# Patient Record
Sex: Male | Born: 1960 | Hispanic: Yes | Marital: Married | Smoking: Never smoker
Health system: Southern US, Community
[De-identification: ages and names within clinical notes are randomized; demographics above are authoritative.]

## PROBLEM LIST (undated history)

## (undated) DIAGNOSIS — E7800 Pure hypercholesterolemia, unspecified: Secondary | ICD-10-CM

## (undated) DIAGNOSIS — I10 Essential (primary) hypertension: Secondary | ICD-10-CM

## (undated) DIAGNOSIS — E11319 Type 2 diabetes mellitus with unspecified diabetic retinopathy without macular edema: Secondary | ICD-10-CM

## (undated) DIAGNOSIS — E119 Type 2 diabetes mellitus without complications: Secondary | ICD-10-CM

## (undated) HISTORY — PX: NO PAST SURGERIES: SHX2092

---

## 2019-08-02 ENCOUNTER — Ambulatory Visit: Payer: Self-pay

## 2019-08-02 ENCOUNTER — Ambulatory Visit: Payer: Self-pay | Attending: Internal Medicine

## 2019-08-02 DIAGNOSIS — Z23 Encounter for immunization: Secondary | ICD-10-CM

## 2019-08-02 NOTE — Progress Notes (Signed)
   Covid-19 Vaccination Clinic  Name:  Jack Gonzalez    MRN: [id number] DOB: 03-02-1961  08/02/2019  Jack Gonzalez was observed post Covid-19 immunization for 15 minutes without incident. He was provided with Vaccine Information Sheet and instruction to access the V-Safe system.   Jack Gonzalez was instructed to call 911 with any severe reactions post vaccine: Marland Kitchen Difficulty breathing  . Swelling of face and throat  . A fast heartbeat  . A bad rash all over body  . Dizziness and weakness   Immunizations Administered    Name Date Dose VIS Date Route   Pfizer COVID-19 Vaccine 08/02/2019  4:35 PM 0.3 mL 04/20/2019 Intramuscular   Manufacturer: [employer], [employer]   Lot: [id number]   NDC: [address]-2

## 2019-08-27 ENCOUNTER — Ambulatory Visit: Payer: Self-pay | Attending: Internal Medicine

## 2019-08-27 DIAGNOSIS — Z23 Encounter for immunization: Secondary | ICD-10-CM

## 2019-08-27 NOTE — Progress Notes (Signed)
   Covid-19 Vaccination Clinic  Name:  Jack Gonzalez    MRN: [id number] DOB: [birth year]  08/27/2019  [name] was observed post Covid-19 immunization for 15 minutes without incident. He was provided with Vaccine Information Sheet and instruction to access the V-Safe system.   [name] was instructed to call 911 with any severe reactions post vaccine: Marland Kitchen Difficulty breathing  . Swelling of face and throat  . A fast heartbeat  . A bad rash all over body  . Dizziness and weakness   Immunizations Administered    Name Date Dose VIS Date Route   Pfizer COVID-19 Vaccine 08/27/2019  9:06 AM 0.3 mL 07/04/2018 Intramuscular   Manufacturer: [employer], [employer]   Lot: [id number]   NDC: [address]-8

## 2020-05-01 ENCOUNTER — Inpatient Hospital Stay (HOSPITAL_BASED_OUTPATIENT_CLINIC_OR_DEPARTMENT_OTHER)
Admission: EM | Admit: 2020-05-01 | Discharge: 2020-05-07 | DRG: 193 | Disposition: A | Discharge status: Home / Self Care | Payer: BC Managed Care – PPO | Attending: Family Medicine | Admitting: Family Medicine

## 2020-05-01 ENCOUNTER — Other Ambulatory Visit: Payer: Self-pay

## 2020-05-01 ENCOUNTER — Emergency Department (HOSPITAL_BASED_OUTPATIENT_CLINIC_OR_DEPARTMENT_OTHER): Payer: BC Managed Care – PPO

## 2020-05-01 ENCOUNTER — Encounter (HOSPITAL_BASED_OUTPATIENT_CLINIC_OR_DEPARTMENT_OTHER): Payer: Self-pay | Admitting: Emergency Medicine

## 2020-05-01 DIAGNOSIS — I160 Hypertensive urgency: Secondary | ICD-10-CM | POA: Diagnosis not present

## 2020-05-01 DIAGNOSIS — E875 Hyperkalemia: Secondary | ICD-10-CM | POA: Diagnosis not present

## 2020-05-01 DIAGNOSIS — E669 Obesity, unspecified: Secondary | ICD-10-CM | POA: Diagnosis present

## 2020-05-01 DIAGNOSIS — Z6841 Body Mass Index (BMI) 40.0 and over, adult: Secondary | ICD-10-CM

## 2020-05-01 DIAGNOSIS — R062 Wheezing: Secondary | ICD-10-CM

## 2020-05-01 DIAGNOSIS — J129 Viral pneumonia, unspecified: Secondary | ICD-10-CM | POA: Diagnosis present

## 2020-05-01 DIAGNOSIS — Z20822 Contact with and (suspected) exposure to covid-19: Secondary | ICD-10-CM | POA: Diagnosis not present

## 2020-05-01 DIAGNOSIS — Z8701 Personal history of pneumonia (recurrent): Secondary | ICD-10-CM

## 2020-05-01 DIAGNOSIS — I161 Hypertensive emergency: Secondary | ICD-10-CM | POA: Diagnosis not present

## 2020-05-01 DIAGNOSIS — N179 Acute kidney failure, unspecified: Secondary | ICD-10-CM | POA: Diagnosis not present

## 2020-05-01 DIAGNOSIS — E7800 Pure hypercholesterolemia, unspecified: Secondary | ICD-10-CM | POA: Diagnosis not present

## 2020-05-01 DIAGNOSIS — Z833 Family history of diabetes mellitus: Secondary | ICD-10-CM

## 2020-05-01 DIAGNOSIS — I248 Other forms of acute ischemic heart disease: Secondary | ICD-10-CM | POA: Diagnosis not present

## 2020-05-01 DIAGNOSIS — R0902 Hypoxemia: Secondary | ICD-10-CM

## 2020-05-01 DIAGNOSIS — T508X5A Adverse effect of diagnostic agents, initial encounter: Secondary | ICD-10-CM | POA: Diagnosis not present

## 2020-05-01 DIAGNOSIS — E1165 Type 2 diabetes mellitus with hyperglycemia: Secondary | ICD-10-CM | POA: Diagnosis not present

## 2020-05-01 DIAGNOSIS — Z8616 Personal history of COVID-19: Secondary | ICD-10-CM

## 2020-05-01 DIAGNOSIS — B338 Other specified viral diseases: Secondary | ICD-10-CM

## 2020-05-01 DIAGNOSIS — E8770 Fluid overload, unspecified: Secondary | ICD-10-CM

## 2020-05-01 DIAGNOSIS — J9601 Acute respiratory failure with hypoxia: Secondary | ICD-10-CM | POA: Diagnosis not present

## 2020-05-01 DIAGNOSIS — E860 Dehydration: Secondary | ICD-10-CM | POA: Diagnosis present

## 2020-05-01 DIAGNOSIS — I5031 Acute diastolic (congestive) heart failure: Secondary | ICD-10-CM | POA: Diagnosis not present

## 2020-05-01 DIAGNOSIS — E785 Hyperlipidemia, unspecified: Secondary | ICD-10-CM | POA: Diagnosis present

## 2020-05-01 DIAGNOSIS — E11319 Type 2 diabetes mellitus with unspecified diabetic retinopathy without macular edema: Secondary | ICD-10-CM | POA: Diagnosis present

## 2020-05-01 DIAGNOSIS — T380X5A Adverse effect of glucocorticoids and synthetic analogues, initial encounter: Secondary | ICD-10-CM | POA: Diagnosis not present

## 2020-05-01 DIAGNOSIS — I110 Hypertensive heart disease with heart failure: Secondary | ICD-10-CM | POA: Diagnosis not present

## 2020-05-01 DIAGNOSIS — F1010 Alcohol abuse, uncomplicated: Secondary | ICD-10-CM | POA: Diagnosis not present

## 2020-05-01 DIAGNOSIS — J121 Respiratory syncytial virus pneumonia: Secondary | ICD-10-CM | POA: Diagnosis not present

## 2020-05-01 DIAGNOSIS — E871 Hypo-osmolality and hyponatremia: Secondary | ICD-10-CM | POA: Diagnosis not present

## 2020-05-01 DIAGNOSIS — Z8249 Family history of ischemic heart disease and other diseases of the circulatory system: Secondary | ICD-10-CM

## 2020-05-01 DIAGNOSIS — R0602 Shortness of breath: Secondary | ICD-10-CM | POA: Diagnosis present

## 2020-05-01 HISTORY — DX: Type 2 diabetes mellitus without complications: E11.9

## 2020-05-01 HISTORY — DX: Essential (primary) hypertension: I10

## 2020-05-01 HISTORY — DX: Type 2 diabetes mellitus with unspecified diabetic retinopathy without macular edema: E11.319

## 2020-05-01 HISTORY — DX: Pure hypercholesterolemia, unspecified: E78.00

## 2020-05-01 LAB — RESP PANEL BY RT-PCR (FLU A&B, COVID) ARPGX2
Influenza A by PCR: NEGATIVE
Influenza B by PCR: NEGATIVE
SARS Coronavirus 2 by RT PCR: NEGATIVE

## 2020-05-01 LAB — COMPREHENSIVE METABOLIC PANEL
ALT: 20 U/L (ref 0–44)
AST: 25 U/L (ref 15–41)
Albumin: 2.8 g/dL — ABNORMAL LOW (ref 3.5–5.0)
Alkaline Phosphatase: 114 U/L (ref 38–126)
Anion gap: 8 (ref 5–15)
BUN: 29 mg/dL — ABNORMAL HIGH (ref 6–20)
CO2: 23 mmol/L (ref 22–32)
Calcium: 8 mg/dL — ABNORMAL LOW (ref 8.9–10.3)
Chloride: 98 mmol/L (ref 98–111)
Creatinine, Ser: 1.23 mg/dL (ref 0.61–1.24)
GFR, Estimated: >60 mL/min (ref >60)
Glucose, Bld: 122 mg/dL — ABNORMAL HIGH (ref 70–99)
Potassium: 4.7 mmol/L (ref 3.5–5.1)
Sodium: 129 mmol/L — ABNORMAL LOW (ref 135–145)
Total Bilirubin: 0.4 mg/dL (ref 0.3–1.2)
Total Protein: 6.7 g/dL (ref 6.5–8.1)

## 2020-05-01 LAB — CBC WITH DIFFERENTIAL/PLATELET
Abs Immature Granulocytes: 0.01 10*3/uL (ref 0.00–0.07)
Basophils Absolute: 0 10*3/uL (ref 0.0–0.1)
Basophils Relative: 1 %
Eosinophils Absolute: 0.1 10*3/uL (ref 0.0–0.5)
Eosinophils Relative: 2 %
HCT: 41.5 % (ref 39.0–52.0)
Hemoglobin: 13.8 g/dL (ref 13.0–17.0)
Immature Granulocytes: 0 %
Lymphocytes Relative: 16 %
Lymphs Abs: 0.8 10*3/uL (ref 0.7–4.0)
MCH: 30.5 pg (ref 26.0–34.0)
MCHC: 33.3 g/dL (ref 30.0–36.0)
MCV: 91.6 fL (ref 80.0–100.0)
Monocytes Absolute: 0.6 10*3/uL (ref 0.1–1.0)
Monocytes Relative: 12 %
Neutro Abs: 3.5 10*3/uL (ref 1.7–7.7)
Neutrophils Relative %: 69 %
Platelets: 178 10*3/uL (ref 150–400)
RBC: 4.53 MIL/uL (ref 4.22–5.81)
RDW: 12.6 % (ref 11.5–15.5)
WBC: 5.1 10*3/uL (ref 4.0–10.5)
nRBC: 0 % (ref 0.0–0.2)

## 2020-05-01 LAB — LACTIC ACID, PLASMA
Lactic Acid, Venous: 1 mmol/L (ref 0.5–1.9)
Lactic Acid, Venous: 0.7 mmol/L (ref 0.5–1.9)

## 2020-05-01 LAB — TROPONIN I (HIGH SENSITIVITY)
Troponin I (High Sensitivity): 22 ng/L — ABNORMAL HIGH (ref <18)
Troponin I (High Sensitivity): 19 ng/L — ABNORMAL HIGH (ref <18)

## 2020-05-01 LAB — GLUCOSE, CAPILLARY
Glucose-Capillary: 308 mg/dL — ABNORMAL HIGH (ref 70–99)
Glucose-Capillary: 337 mg/dL — ABNORMAL HIGH (ref 70–99)

## 2020-05-01 LAB — BRAIN NATRIURETIC PEPTIDE: B Natriuretic Peptide: 169.2 pg/mL — ABNORMAL HIGH (ref 0.0–100.0)

## 2020-05-01 LAB — D-DIMER, QUANTITATIVE: D-Dimer, Quant: 1.51 ug/mL-FEU — ABNORMAL HIGH (ref 0.00–0.50)

## 2020-05-01 MED ORDER — LORAZEPAM 2 MG/ML IJ SOLN: 1.0000 mg | INTRAMUSCULAR | Status: AC | PRN

## 2020-05-01 MED ORDER — METHYLPREDNISOLONE SODIUM SUCC 125 MG IJ SOLR
60.0000 mg | INTRAMUSCULAR | Status: DC
  Administered 2020-05-02: 07:00:00 60 mg via INTRAVENOUS
  Filled 2020-05-01: qty 2

## 2020-05-01 MED ORDER — SODIUM CHLORIDE 0.9% FLUSH
3.0000 mL | Freq: Two times a day (BID) | INTRAVENOUS | Status: DC
  Administered 2020-05-01 – 2020-05-06 (×7): 3 mL via INTRAVENOUS

## 2020-05-01 MED ORDER — THIAMINE HCL 100 MG PO TABS
100.0000 mg | ORAL_TABLET | Freq: Every day | ORAL | Status: DC
  Administered 2020-05-01 – 2020-05-07 (×7): 100 mg via ORAL
  Filled 2020-05-01 (×7): qty 1

## 2020-05-01 MED ORDER — HYDRALAZINE HCL 20 MG/ML IJ SOLN
10.0000 mg | Freq: Four times a day (QID) | INTRAMUSCULAR | Status: DC | PRN
  Administered 2020-05-02 – 2020-05-07 (×4): 10 mg via INTRAVENOUS
  Filled 2020-05-01 (×4): qty 1

## 2020-05-01 MED ORDER — ONDANSETRON HCL 4 MG/2ML IJ SOLN: 4.0000 mg | Freq: Four times a day (QID) | INTRAMUSCULAR | Status: DC | PRN

## 2020-05-01 MED ORDER — ALBUTEROL SULFATE (2.5 MG/3ML) 0.083% IN NEBU
2.5000 mg | INHALATION_SOLUTION | RESPIRATORY_TRACT | Status: DC | PRN
  Administered 2020-05-02 – 2020-05-03 (×3): 2.5 mg via RESPIRATORY_TRACT
  Filled 2020-05-01 (×3): qty 3

## 2020-05-01 MED ORDER — ALBUTEROL SULFATE HFA 108 (90 BASE) MCG/ACT IN AERS
INHALATION_SPRAY | RESPIRATORY_TRACT | Status: AC
  Administered 2020-05-01: 6
  Filled 2020-05-01: qty 6.7

## 2020-05-01 MED ORDER — ATORVASTATIN CALCIUM 40 MG PO TABS
80.0000 mg | ORAL_TABLET | Freq: Every day | ORAL | Status: DC
  Administered 2020-05-01 – 2020-05-07 (×7): 80 mg via ORAL
  Filled 2020-05-01 (×7): qty 2

## 2020-05-01 MED ORDER — SODIUM CHLORIDE 0.9% FLUSH: 3.0000 mL | INTRAVENOUS | Status: DC | PRN

## 2020-05-01 MED ORDER — LISINOPRIL 20 MG PO TABS
40.0000 mg | ORAL_TABLET | Freq: Every day | ORAL | Status: DC
  Administered 2020-05-01: 22:00:00 40 mg via ORAL
  Filled 2020-05-01: qty 2

## 2020-05-01 MED ORDER — ADULT MULTIVITAMIN W/MINERALS CH
1.0000 | ORAL_TABLET | Freq: Every day | ORAL | Status: DC
  Administered 2020-05-01 – 2020-05-07 (×7): 1 via ORAL
  Filled 2020-05-01 (×7): qty 1

## 2020-05-01 MED ORDER — ACETAMINOPHEN 650 MG RE SUPP: 650.0000 mg | Freq: Four times a day (QID) | RECTAL | Status: DC | PRN

## 2020-05-01 MED ORDER — SODIUM CHLORIDE 0.9 % IV SOLN: 250.0000 mL | INTRAVENOUS | Status: DC | PRN

## 2020-05-01 MED ORDER — LABETALOL HCL 5 MG/ML IV SOLN
10.0000 mg | Freq: Once | INTRAVENOUS | Status: AC
  Administered 2020-05-01: 10 mg via INTRAVENOUS
  Filled 2020-05-01: qty 4

## 2020-05-01 MED ORDER — ONDANSETRON HCL 4 MG PO TABS: 4.0000 mg | ORAL_TABLET | Freq: Four times a day (QID) | ORAL | Status: DC | PRN

## 2020-05-01 MED ORDER — LORAZEPAM 1 MG PO TABS: 1.0000 mg | ORAL_TABLET | ORAL | Status: AC | PRN

## 2020-05-01 MED ORDER — IOHEXOL 350 MG/ML SOLN
100.0000 mL | Freq: Once | INTRAVENOUS | Status: AC | PRN
  Administered 2020-05-01: 12:00:00 100 mL via INTRAVENOUS

## 2020-05-01 MED ORDER — POLYETHYLENE GLYCOL 3350 17 G PO PACK: 17.0000 g | PACK | Freq: Every day | ORAL | Status: DC | PRN

## 2020-05-01 MED ORDER — INSULIN GLARGINE 100 UNIT/ML ~~LOC~~ SOLN
60.0000 [IU] | Freq: Every day | SUBCUTANEOUS | Status: DC
  Administered 2020-05-02 – 2020-05-03 (×2): 60 [IU] via SUBCUTANEOUS
  Filled 2020-05-01 (×2): qty 0.6

## 2020-05-01 MED ORDER — CHLORTHALIDONE 25 MG PO TABS
25.0000 mg | ORAL_TABLET | Freq: Every day | ORAL | Status: DC
  Administered 2020-05-01: 22:00:00 25 mg via ORAL
  Filled 2020-05-01: qty 1

## 2020-05-01 MED ORDER — THIAMINE HCL 100 MG/ML IJ SOLN
100.0000 mg | Freq: Every day | INTRAMUSCULAR | Status: DC
  Filled 2020-05-01 (×2): qty 2

## 2020-05-01 MED ORDER — INSULIN ASPART 100 UNIT/ML ~~LOC~~ SOLN
0.0000 [IU] | Freq: Three times a day (TID) | SUBCUTANEOUS | Status: DC
  Administered 2020-05-02: 13:00:00 8 [IU] via SUBCUTANEOUS
  Administered 2020-05-02: 17:00:00 15 [IU] via SUBCUTANEOUS
  Administered 2020-05-02: 09:00:00 3 [IU] via SUBCUTANEOUS

## 2020-05-01 MED ORDER — ACETAMINOPHEN 325 MG PO TABS: 650.0000 mg | ORAL_TABLET | Freq: Four times a day (QID) | ORAL | Status: DC | PRN

## 2020-05-01 MED ORDER — METHYLPREDNISOLONE SODIUM SUCC 125 MG IJ SOLR
125.0000 mg | Freq: Once | INTRAMUSCULAR | Status: AC
  Administered 2020-05-01: 10:00:00 125 mg via INTRAVENOUS
  Filled 2020-05-01: qty 2

## 2020-05-01 MED ORDER — INSULIN ASPART 100 UNIT/ML ~~LOC~~ SOLN
0.0000 [IU] | Freq: Every day | SUBCUTANEOUS | Status: DC
  Administered 2020-05-01: 21:00:00 4 [IU] via SUBCUTANEOUS
  Administered 2020-05-02: 23:00:00 5 [IU] via SUBCUTANEOUS

## 2020-05-01 MED ORDER — FOLIC ACID 1 MG PO TABS
1.0000 mg | ORAL_TABLET | Freq: Every day | ORAL | Status: DC
  Administered 2020-05-01 – 2020-05-07 (×7): 1 mg via ORAL
  Filled 2020-05-01 (×7): qty 1

## 2020-05-01 MED ORDER — ENOXAPARIN SODIUM 80 MG/0.8ML ~~LOC~~ SOLN
65.0000 mg | SUBCUTANEOUS | Status: DC
  Administered 2020-05-01 – 2020-05-03 (×3): 65 mg via SUBCUTANEOUS
  Filled 2020-05-01 (×3): qty 0.8

## 2020-05-01 NOTE — ED Provider Notes (Signed)
[facility] EMERGENCY DEPARTMENT Provider Note   CSN: [id number] Arrival date & time: 05/01/20  2025     History Chief Complaint  Patient presents with  . Shortness of Breath    Jack Gonzalez is a 59 y.o. male.  The history is provided by the patient and medical records. The history is limited by a language barrier. A language interpreter was used (due to emergent condition and respiratory distress, son was used for initial interpretation).  Shortness of Breath Severity:  Severe Onset quality:  Gradual Duration:  4 days Timing:  Constant Progression:  Worsening Chronicity:  New Context: URI and weather changes   Relieved by:  Nothing Worsened by:  Coughing Ineffective treatments:  None tried Associated symptoms: cough, sputum production and wheezing   Associated symptoms: no abdominal pain, no chest pain, no diaphoresis, no fever, no headaches, no hemoptysis, no rash and no vomiting        Past Medical History:  Diagnosis Date  . Diabetes mellitus without complication (HCC)   . High cholesterol   . Hypertension     There are no problems to display for this patient.   History reviewed. No pertinent surgical history.     No family history on file.  Social History   Tobacco Use  . Smoking status: Never Smoker  . Smokeless tobacco: Never Used  Substance Use Topics  . Alcohol use: Yes    Home Medications Prior to Admission medications   Not on File    Allergies    Patient has no known allergies.  Review of Systems   Review of Systems  Constitutional: Positive for chills and fatigue. Negative for diaphoresis and fever.  HENT: Positive for congestion.   Eyes: Negative for visual disturbance.  Respiratory: Positive for cough, sputum production, chest tightness, shortness of breath and wheezing. Negative for hemoptysis and stridor.   Cardiovascular: Positive for leg swelling (at baseline). Negative for chest pain and palpitations.   Gastrointestinal: Negative for abdominal pain, constipation, diarrhea, nausea and vomiting.  Genitourinary: Negative for flank pain and frequency.  Musculoskeletal: Negative for back pain and neck stiffness.  Skin: Negative for rash and wound.  Neurological: Negative for dizziness, light-headedness and headaches.  Psychiatric/Behavioral: Negative for agitation.  All other systems reviewed and are negative.   Physical Exam Updated Vital Signs BP (!) 215/101 (BP Location: Right Arm)   Pulse 84   Temp 99.5 F (37.5 C) (Oral)   Resp (!) 28   Ht 5\' 10"  (1.778 m)   Wt 132.5 kg   SpO2 99%   BMI 41.90 kg/m   Physical Exam Vitals and nursing note reviewed.  Constitutional:      General: He is in acute distress.     Appearance: He is well-developed and well-nourished. He is obese. He is ill-appearing. He is not toxic-appearing or diaphoretic.  HENT:     Head: Normocephalic and atraumatic.     Mouth/Throat:     Pharynx: Oropharynx is clear.  Eyes:     Conjunctiva/sclera: Conjunctivae normal.     Pupils: Pupils are equal, round, and reactive to light.  Cardiovascular:     Rate and Rhythm: Normal rate and regular rhythm.  No extrasystoles are present.    Pulses: Normal pulses.     Heart sounds: No murmur heard.   Pulmonary:     Effort: Pulmonary effort is normal. Tachypnea present. No respiratory distress.     Breath sounds: Wheezing, rhonchi and rales present.  Chest:  Chest wall: No tenderness.  Abdominal:     Palpations: Abdomen is soft.     Tenderness: There is no abdominal tenderness.  Musculoskeletal:     Cervical back: Normal range of motion and neck supple.     Right lower leg: No tenderness. Edema present.     Left lower leg: No tenderness. Edema present.  Skin:    General: Skin is warm and dry.     Coloration: Skin is not pale.     Findings: No erythema.  Neurological:     General: No focal deficit present.     Mental Status: He is alert.  Psychiatric:         Mood and Affect: Mood and affect and mood normal.     ED Results / Procedures / Treatments   Labs (all labs ordered are listed, but only abnormal results are displayed) Labs Reviewed  COMPREHENSIVE METABOLIC PANEL - Abnormal; Notable for the following components:      Result Value   Sodium 129 (*)    Glucose, Bld 122 (*)    BUN 29 (*)    Calcium 8.0 (*)    Albumin 2.8 (*)    All other components within normal limits  BRAIN NATRIURETIC PEPTIDE - Abnormal; Notable for the following components:   B Natriuretic Peptide 169.2 (*)    All other components within normal limits  D-DIMER, QUANTITATIVE (NOT AT [facility]) - Abnormal; Notable for the following components:   D-Dimer, Quant 1.51 (*)    All other components within normal limits  TROPONIN I (HIGH SENSITIVITY) - Abnormal; Notable for the following components:   Troponin I (High Sensitivity) 22 (*)    All other components within normal limits  TROPONIN I (HIGH SENSITIVITY) - Abnormal; Notable for the following components:   Troponin I (High Sensitivity) 19 (*)    All other components within normal limits  RESP PANEL BY RT-PCR (FLU A&B, COVID) ARPGX2  CULTURE, BLOOD (ROUTINE X 2)  CULTURE, BLOOD (ROUTINE X 2)  CBC WITH DIFFERENTIAL/PLATELET  LACTIC ACID, PLASMA  LACTIC ACID, PLASMA    EKG None ED ECG REPORT   Date: 05/01/2020  Rate: 84  Rhythm: normal sinus rhythm  QRS Axis: normal  Intervals: PR prolonged  ST/T Wave abnormalities: early repolarization  Conduction Disutrbances:none  Narrative Interpretation:   Old EKG Reviewed: none available  I have personally reviewed the EKG tracing and agree with the computerized printout as noted.    Radiology CT Angio Chest PE W and/or Wo Contrast  Result Date: 05/01/2020 CLINICAL DATA:  Cough and shortness of breath. Question pulmonary emboli. Low oxygen saturation. EXAM: CT ANGIOGRAPHY CHEST WITH CONTRAST TECHNIQUE: Multidetector CT imaging of the chest was performed  using the standard protocol during bolus administration of intravenous contrast. Multiplanar CT image reconstructions and MIPs were obtained to evaluate the vascular anatomy. CONTRAST:  OMNIPAQUE IOHEXOL 350 MG/ML SOLN COMPARISON:  Chest radiography same day FINDINGS: Cardiovascular: Heart size upper limits of normal. Coronary artery calcification is noted. Aortic atherosclerotic calcification is noted. No aneurysm or dissection. Pulmonary arterial opacification is good. There are no pulmonary emboli. Mediastinum/Nodes: No mediastinal or hilar mass or lymphadenopathy. Lungs/Pleura: Bilateral hazy and patchy pulmonary infiltrates, most pronounced in the right upper lobe. Findings are nonspecific and could be due to viral or bacterial pneumonia. No dense consolidation or lobar collapse. No effusion. Upper Abdomen: Negative Musculoskeletal: Ordinary thoracic degenerative changes. Review of the MIP images confirms the above findings. IMPRESSION: 1. No pulmonary emboli. 2. Bilateral  hazy and patchy pulmonary infiltrates, most pronounced in the right upper lobe. Findings are nonspecific and could be due to viral or bacterial pneumonia. No dense consolidation or lobar collapse. 3. Aortic atherosclerosis. Coronary artery calcification. Aortic Atherosclerosis (ICD10-I70.0). Electronically Signed   By: Paulina Fusi M.D.   On: 05/01/2020 11:40   DG Chest Portable 1 View  Result Date: 05/01/2020 CLINICAL DATA:  Hypoxia. EXAM: PORTABLE CHEST 1 VIEW COMPARISON:  None. FINDINGS: Enlarged cardiac silhouette. Pulmonary vascular congestion. No consolidation. No visible pleural effusions or pneumothorax. No acute osseous abnormality. IMPRESSION: Cardiomegaly and pulmonary vascular congestion without overt pulmonary edema. No focal consolidation. Electronically Signed   By: Feliberto Harts MD   On: 05/01/2020 10:30    Procedures Procedures (including critical care time)  CRITICAL CARE Performed by: Canary Brim  Hester Joslin Total critical care time: 45 minutes Critical care time was exclusive of separately billable procedures and treating other patients. Critical care was necessary to treat or prevent imminent or life-threatening deterioration. Critical care was time spent personally by me on the following activities: development of treatment plan with patient and/or surrogate as well as nursing, discussions with consultants, evaluation of patient's response to treatment, examination of patient, obtaining history from patient or surrogate, ordering and performing treatments and interventions, ordering and review of laboratory studies, ordering and review of radiographic studies, pulse oximetry and re-evaluation of patient's condition.  Medications Ordered in ED Medications  albuterol (VENTOLIN HFA) 108 (90 Base) MCG/ACT inhaler (6 puffs  Given 05/01/20 1004)  labetalol (NORMODYNE) injection 10 mg (10 mg Intravenous Given 05/01/20 1025)  methylPREDNISolone sodium succinate (SOLU-MEDROL) 125 mg/2 mL injection 125 mg (125 mg Intravenous Given 05/01/20 1021)  iohexol (OMNIPAQUE) 350 MG/ML injection 100 mL (100 mLs Intravenous Contrast Given 05/01/20 1133)    ED Course  I have reviewed the triage vital signs and the nursing notes.  Pertinent labs & imaging results that were available during my care of the patient were reviewed by me and considered in my medical decision making (see chart for details).    MDM Rules/Calculators/A&P                          Annette Lowdermilk is a 59 y.o. male with a past medical history significant for hypertension, hypercholesterolemia, diabetes, recent pneumonia several weeks ago and prior coronavirus infection several months ago who presents with worsening shortness of breath, chest tightness, wheezing, cough, malaise, and hypoxia.  According to son, the patient just got over a pneumonia several weeks ago.  He reports that patient was outside in the cold on Saturday in the rain  and then on Sunday started having shortness of breath and cough.  He reports it is progressed until today when he was very short of breath needing to come to emergency department.  On arrival, patient was found to oxygen saturation of 76% on room air.  Patient reports his wheezing has been progressive and he is coughing up phlegm.  Denies any hemoptysis.  Denies history of DVT or PE.  Reports his legs are all swollen for the last year or so.  Denies history of heart failure.  Denies sick contacts.  He reports that he had delta variant of COVID-19 over 3 months ago and had a recent pneumonia.  He is denying any chest pain right now and denies any abdominal pain.  Denies nausea, vomiting, constipation, diarrhea, or urinary changes.  Denies sick contacts.  On exam, patient has rales, rhonchi,  and wheezing in all lung fields.  Chest and abdomen are nontender.  No murmur.  Legs are edematous bilaterally.  Intact pulses in all extremities.  Patient is warm to the touch and abnormal temperature of 9.5, will get rectal temp.  He is not tachycardic but is tachypneic and hypertensive with blood pressure of 215/101.    Clinical I am concerned about several etiologies.  I am concerned about hypertensive emergency causing flash pulmonary edema leading to this hypoxia versus infection.  We will recheck him for Covid as been several months since he was last positive.  We will get x-ray to look for pneumonia.  We will get a BNP and get his blood pressure improved to look for fluid overload with flash pulmonary edema.  We will give him albuterol and steroids to help with the wheezing component of the shortness of breath.  We will also get troponin and EKG and labs to look for cardiac cause of symptoms.  Patient and family agreed with plan.  They report that they would prefer to be admitted to [facility] if they need admission which anticipate they will given his hypoxia of 76% on room air on arrival.  Anticipate  reassessment after work-up.  10:48 AM Dimer returned positive.  Will order CT PE study which she can get if his creatinine is reassuring.   2:10 PM Patient's work of breathing has improved and is now only on 2 L nasal cannula down from nonrebreather.  His lactic acid was negative x2.  His troponin was elevated at 22 but improved to 19.  He is denying any further chest pain.  Short of breath has somewhat improved but he still requiring oxygen.  His Covid test and flu test were negative however, this facility, the tested him for other viral infections and he was found to have RSV.  When told this, the family says that a child in the family has recently had RSV.  Other labs showed no leukocytosis or anemia.  Metabolic panel showed some hyponatremia and hypocalcemia but otherwise kidney function and liver function were not elevated.  CT PE study was ordered and showed likely viral opacities with no evidence of pulmonary embolism.  Patient reports that he would like to be admitted to [facility] if there is space.  Will call to see about getting admitted there for RSV pneumonia with new oxygen requirement and oxygen saturations in the 70s on arrival.   2:42 PM We spoke with [facility] and they report that they cannot accept the patient for admission for hypoxic respiratory failure related to RSV as they are full.  Patient will thus be admitted to one of our hospitalist and hospitalist team was called for admission.   Final Clinical Impression(s) / ED Diagnoses Final diagnoses:  Hypoxia  RSV (respiratory syncytial virus infection)  Acute respiratory failure with hypoxia (HCC)     Clinical Impression: 1. Hypoxia   2. RSV (respiratory syncytial virus infection)   3. Acute respiratory failure with hypoxia (HCC)     Disposition: Admit  This note was prepared with assistance of Dragon voice recognition software. Occasional wrong-word or sound-a-like substitutions  may have occurred due to the inherent limitations of voice recognition software.     Apryle Stowell, Canary Brimhristopher J, MD 05/01/20 [phone]

## 2020-05-01 NOTE — ED Triage Notes (Signed)
Pt is here with his son who is translating. Pt c/o cough and SOB x 3 days. O2 sats 76% on room air. RT to bedside.

## 2020-05-01 NOTE — ED Notes (Signed)
Report provided to [facility] for report

## 2020-05-01 NOTE — ED Notes (Signed)
ED Provider at bedside. 

## 2020-05-01 NOTE — ED Notes (Signed)
This RN collected 1 set of blood cultures, lactic, 2 lavender tubes, 2 light green tubes, and a blue tube.

## 2020-05-01 NOTE — ED Notes (Signed)
ED Provider at bedside to discuss admission plan

## 2020-05-01 NOTE — ED Notes (Signed)
Lab called to report RSV+. EDP made aware 

## 2020-05-01 NOTE — H&P (Signed)
History and Physical    [name] OXB:[id number] DOB: 19-Jul-1960 DOA: 05/01/2020  PCP: System, Provider Not In  Patient coming from: Home -- > [facility]   Chief Complaint: SOB   HPI: Jack Gonzalez is a 59 y.o. male with medical history significant of DM, HTN, HLD who presents to the hospital with chief complaint of SOB, chest tightness, wheezing, cough, malaise, hypoxia. She was recently treated for pneumonia several weeks ago. On arrival to the ED, her oxygen saturation was found to be 76% on room air. He had also had COVID+ over 3 months ago.  On examination, he denies any fevers or chest pain or abdominal pain or nausea, vomiting or diarrhea.  He does admit to dry cough.  His 30-month-old granddaughter had tested positive for RSV recently.  He also admits to 240 ounce beer consumption on a daily basis, last use yesterday 12/22.  Patient was examined with iPad interpreter as well as son at bedside.  ED Course: Lab called med [facility] ED provider with positive result of RSV.  Patient was given Solu-Medrol as well as IV labetalol and transferred to [facility] for admission.  Review of Systems: As per HPI. Otherwise, all other review of systems reviewed and are negative.   Past Medical History:  Diagnosis Date  . Diabetes mellitus without complication (HCC)   . Diabetic retinopathy (HCC)   . High cholesterol   . Hypertension     Past Surgical History:  Procedure Laterality Date  . NO PAST SURGERIES       reports that he has never smoked. He has never used smokeless tobacco. He reports current alcohol use. No history on file for drug use.  No Known Allergies  Family History  Problem Relation Age of Onset  . Diabetes Mother   . Hypertension Mother   . Hypertension Sister     Prior to Admission medications   Not on File    Physical Exam: Vitals:   05/01/20 1430 05/01/20 1500 05/01/20 1530 05/01/20 1652  BP: (!) 161/86 (!) 183/87 (!) 174/80 (!) 167/86   Pulse: 76 78 74 81  Resp: 20 20 14 20   Temp: 98.4 F (36.9 C)  98.7 F (37.1 C) 98.4 F (36.9 C)  TempSrc: Oral  Oral Oral  SpO2: 97% 97% 99% 99%  Weight:      Height:         Constitutional: NAD, calm, comfortable Eyes: PERRL, lids and conjunctivae normal ENMT: Unable to examine as patient is wearing a facemask Respiratory: Mild increase in respiratory effort with conversation, bilateral expiratory wheezes, on 2 L nasal cannula O2 Cardiovascular: Regular rate and rhythm, no murmurs. No extremity edema.  Abdomen: Soft, nondistended, nontender to palpation. Bowel sounds positive.  Musculoskeletal: No joint deformity upper and lower extremities. No contractures. Normal muscle tone.  Skin: no rashes, lesions, ulcers on exposed skin  Neurologic: Alert and oriented, speech fluent, CN 2-12 grossly intact. No focal deficits.   Psychiatric: Normal judgment and insight. Normal mood and affect   Labs on Admission: I have personally reviewed following labs and imaging studies  CBC: Recent Labs  Lab 05/01/20 1000  WBC 5.1  NEUTROABS 3.5  HGB 13.8  HCT 41.5  MCV 91.6  PLT 178   Basic Metabolic Panel: Recent Labs  Lab 05/01/20 1000  NA 129*  K 4.7  CL 98  CO2 23  GLUCOSE 122*  BUN 29*  CREATININE 1.23  CALCIUM 8.0*   GFR: Estimated Creatinine Clearance:  89.6 mL/min (by C-G formula based on SCr of 1.23 mg/dL). Liver Function Tests: Recent Labs  Lab 05/01/20 1000  AST 25  ALT 20  ALKPHOS 114  BILITOT 0.4  PROT 6.7  ALBUMIN 2.8*   No results for input(s): LIPASE, AMYLASE in the last 168 hours. No results for input(s): AMMONIA in the last 168 hours. Coagulation Profile: No results for input(s): INR, PROTIME in the last 168 hours. Cardiac Enzymes: No results for input(s): CKTOTAL, CKMB, CKMBINDEX, TROPONINI in the last 168 hours. BNP (last 3 results) No results for input(s): PROBNP in the last 8760 hours. HbA1C: No results for input(s): HGBA1C in the last 72  hours. CBG: No results for input(s): GLUCAP in the last 168 hours. Lipid Profile: No results for input(s): CHOL, HDL, LDLCALC, TRIG, CHOLHDL, LDLDIRECT in the last 72 hours. Thyroid Function Tests: No results for input(s): TSH, T4TOTAL, FREET4, T3FREE, THYROIDAB in the last 72 hours. Anemia Panel: No results for input(s): VITAMINB12, FOLATE, FERRITIN, TIBC, IRON, RETICCTPCT in the last 72 hours. Urine analysis: No results found for: COLORURINE, APPEARANCEUR, LABSPEC, PHURINE, GLUCOSEU, HGBUR, BILIRUBINUR, KETONESUR, PROTEINUR, UROBILINOGEN, NITRITE, LEUKOCYTESUR Sepsis Labs: !!!!!!!!!!!!!!!!!!!!!!!!!!!!!!!!!!!!!!!!!!!! @LABRCNTIP (procalcitonin:4,lacticidven:4) ) Recent Results (from the past 240 hour(s))  Resp Panel by RT-PCR (Flu A&B, Covid) Nasopharyngeal Swab     Status: None   Collection Time: 05/01/20 10:20 AM   Specimen: Nasopharyngeal Swab; Nasopharyngeal(NP) swabs in vial transport medium  Result Value Ref Range Status   SARS Coronavirus 2 by RT PCR NEGATIVE NEGATIVE Final    Comment: (NOTE) SARS-CoV-2 target nucleic acids are NOT DETECTED.  The SARS-CoV-2 RNA is generally detectable in upper respiratory specimens during the acute phase of infection. The lowest concentration of SARS-CoV-2 viral copies this assay can detect is 138 copies/mL. A negative result does not preclude SARS-Cov-2 infection and should not be used as the sole basis for treatment or other patient management decisions. A negative result may occur with  improper specimen collection/handling, submission of specimen other than nasopharyngeal swab, presence of viral mutation(s) within the areas targeted by this assay, and inadequate number of viral copies(<138 copies/mL). A negative result must be combined with clinical observations, patient history, and epidemiological information. The expected result is Negative.  Fact Sheet for Patients:  05/03/20  Fact Sheet for  Healthcare Providers:  BloggerCourse.com  This test is no t yet approved or cleared by the [place] FDA and  has been authorized for detection and/or diagnosis of SARS-CoV-2 by FDA under an Emergency Use Authorization (EUA). This EUA will remain  in effect (meaning this test can be used) for the duration of the COVID-19 declaration under Section 564(b)(1) of the Act, 21 U.S.C.section 360bbb-3(b)(1), unless the authorization is terminated  or revoked sooner.       Influenza A by PCR NEGATIVE NEGATIVE Final   Influenza B by PCR NEGATIVE NEGATIVE Final    Comment: (NOTE) The Xpert Xpress SARS-CoV-2/FLU/RSV plus assay is intended as an aid in the diagnosis of influenza from Nasopharyngeal swab specimens and should not be used as a sole basis for treatment. Nasal washings and aspirates are unacceptable for Xpert Xpress SARS-CoV-2/FLU/RSV testing.  Fact Sheet for Patients: [place]  Fact Sheet for Healthcare Providers: BloggerCourse.com  This test is not yet approved or cleared by the [employer]ious[employer] FDA and has been authorized for detection and/or diagnosis of SARS-CoV-2 by FDA under an Emergency Use Authorization (EUA). This EUA will remain in effect (meaning this test can be used) for the duration of the COVID-19 declaration  under Section 564(b)(1) of the Act, 21 U.S.C. section 360bbb-3(b)(1), unless the authorization is terminated or revoked.  Performed at [facility], [address] [place], [place] [address]      Radiological Exams on Admission: CT Angio Chest PE W and/or Wo Contrast  Result Date: 05/01/2020 CLINICAL DATA:  Cough and shortness of breath. Question pulmonary emboli. Low oxygen saturation. EXAM: CT ANGIOGRAPHY CHEST WITH CONTRAST TECHNIQUE: Multidetector CT imaging of the chest was performed using the standard protocol during bolus administration of intravenous  contrast. Multiplanar CT image reconstructions and MIPs were obtained to evaluate the vascular anatomy. CONTRAST:  OMNIPAQUE IOHEXOL 350 MG/ML SOLN COMPARISON:  Chest radiography same day FINDINGS: Cardiovascular: Heart size upper limits of normal. Coronary artery calcification is noted. Aortic atherosclerotic calcification is noted. No aneurysm or dissection. Pulmonary arterial opacification is good. There are no pulmonary emboli. Mediastinum/Nodes: No mediastinal or hilar mass or lymphadenopathy. Lungs/Pleura: Bilateral hazy and patchy pulmonary infiltrates, most pronounced in the right upper lobe. Findings are nonspecific and could be due to viral or bacterial pneumonia. No dense consolidation or lobar collapse. No effusion. Upper Abdomen: Negative Musculoskeletal: Ordinary thoracic degenerative changes. Review of the MIP images confirms the above findings. IMPRESSION: 1. No pulmonary emboli. 2. Bilateral hazy and patchy pulmonary infiltrates, most pronounced in the right upper lobe. Findings are nonspecific and could be due to viral or bacterial pneumonia. No dense consolidation or lobar collapse. 3. Aortic atherosclerosis. Coronary artery calcification. Aortic Atherosclerosis (ICD10-I70.0). Electronically Signed   By: Paulina Fusi M.D.   On: 05/01/2020 11:40   DG Chest Portable 1 View  Result Date: 05/01/2020 CLINICAL DATA:  Hypoxia. EXAM: PORTABLE CHEST 1 VIEW COMPARISON:  None. FINDINGS: Enlarged cardiac silhouette. Pulmonary vascular congestion. No consolidation. No visible pleural effusions or pneumothorax. No acute osseous abnormality. IMPRESSION: Cardiomegaly and pulmonary vascular congestion without overt pulmonary edema. No focal consolidation. Electronically Signed   By: Feliberto Harts MD   On: 05/01/2020 10:30    Assessment/Plan Active Problems:   Viral pneumonia   Acute hypoxemic respiratory failure secondary to RSV -SpO2 76% on room air, now requiring 2L [place] O2. Wean as  able -Influenza and COVID negative  -Per EDP, patient tested positive for RSV (I'm unable to view results in epic)  -Solu-Medrol and breathing treatments due to expiratory wheezes  Hypertensive urgency -BP 215/101 in the ED -Resume home medications including chlorthalidone 25mg  daily, lisinopril 40mg  daily   HLD -Lipitor 80mg  daily   DM -Hold invokamet, ozempic -Lantus, SSI  Demand ischemia -Trop 22 --> 19   Alcohol abuse -CIWA, last drink 12/22    DVT prophylaxis: Lovenox   Code Status: Full code Family Communication: Son at bedside Disposition Plan: Pending clinical improvement, suspect patient will return home once clinically improved Consults called: None  Status is: Observation  The patient remains OBS appropriate and will d/c before 2 midnights.  Dispo: The patient is from: Home              Anticipated d/c is to: Home              Anticipated d/c date is: 1 day              Patient currently is not medically stable to d/c.   Severity of Illness: The appropriate patient status for this patient is OBSERVATION. Observation status is judged to be reasonable and necessary in order to provide the required intensity of service to ensure the patient's safety. The  patient's presenting symptoms, physical exam findings, and initial radiographic and laboratory data in the context of their medical condition is felt to place them at decreased risk for further clinical deterioration. Furthermore, it is anticipated that the patient will be medically stable for discharge from the hospital within 2 midnights of admission.     Noralee StainJennifer Hessie Varone, DO [facility] 05/01/2020, 5:26 PM   Available via Epic secure chat 7am-7pm After these hours, please refer to coverage provider listed on [email or web address]

## 2020-05-02 ENCOUNTER — Observation Stay (HOSPITAL_COMMUNITY): Payer: BC Managed Care – PPO

## 2020-05-02 DIAGNOSIS — T380X5A Adverse effect of glucocorticoids and synthetic analogues, initial encounter: Secondary | ICD-10-CM | POA: Diagnosis not present

## 2020-05-02 DIAGNOSIS — T508X5A Adverse effect of diagnostic agents, initial encounter: Secondary | ICD-10-CM | POA: Diagnosis present

## 2020-05-02 DIAGNOSIS — J121 Respiratory syncytial virus pneumonia: Secondary | ICD-10-CM | POA: Diagnosis present

## 2020-05-02 DIAGNOSIS — Z8616 Personal history of COVID-19: Secondary | ICD-10-CM | POA: Diagnosis not present

## 2020-05-02 DIAGNOSIS — E1165 Type 2 diabetes mellitus with hyperglycemia: Secondary | ICD-10-CM | POA: Diagnosis not present

## 2020-05-02 DIAGNOSIS — I248 Other forms of acute ischemic heart disease: Secondary | ICD-10-CM | POA: Diagnosis present

## 2020-05-02 DIAGNOSIS — R0603 Acute respiratory distress: Secondary | ICD-10-CM | POA: Diagnosis not present

## 2020-05-02 DIAGNOSIS — I350 Nonrheumatic aortic (valve) stenosis: Secondary | ICD-10-CM | POA: Diagnosis not present

## 2020-05-02 DIAGNOSIS — B974 Respiratory syncytial virus as the cause of diseases classified elsewhere: Secondary | ICD-10-CM | POA: Diagnosis not present

## 2020-05-02 DIAGNOSIS — Z20822 Contact with and (suspected) exposure to covid-19: Secondary | ICD-10-CM | POA: Diagnosis present

## 2020-05-02 DIAGNOSIS — I160 Hypertensive urgency: Secondary | ICD-10-CM | POA: Diagnosis present

## 2020-05-02 DIAGNOSIS — I161 Hypertensive emergency: Secondary | ICD-10-CM | POA: Diagnosis present

## 2020-05-02 DIAGNOSIS — I361 Nonrheumatic tricuspid (valve) insufficiency: Secondary | ICD-10-CM | POA: Diagnosis not present

## 2020-05-02 DIAGNOSIS — R0602 Shortness of breath: Secondary | ICD-10-CM | POA: Diagnosis present

## 2020-05-02 DIAGNOSIS — Z8701 Personal history of pneumonia (recurrent): Secondary | ICD-10-CM | POA: Diagnosis not present

## 2020-05-02 DIAGNOSIS — I5031 Acute diastolic (congestive) heart failure: Secondary | ICD-10-CM | POA: Diagnosis not present

## 2020-05-02 DIAGNOSIS — J9601 Acute respiratory failure with hypoxia: Secondary | ICD-10-CM

## 2020-05-02 DIAGNOSIS — E11319 Type 2 diabetes mellitus with unspecified diabetic retinopathy without macular edema: Secondary | ICD-10-CM | POA: Diagnosis present

## 2020-05-02 DIAGNOSIS — E871 Hypo-osmolality and hyponatremia: Secondary | ICD-10-CM | POA: Diagnosis present

## 2020-05-02 DIAGNOSIS — E7800 Pure hypercholesterolemia, unspecified: Secondary | ICD-10-CM | POA: Diagnosis present

## 2020-05-02 DIAGNOSIS — I340 Nonrheumatic mitral (valve) insufficiency: Secondary | ICD-10-CM | POA: Diagnosis not present

## 2020-05-02 DIAGNOSIS — E860 Dehydration: Secondary | ICD-10-CM | POA: Diagnosis present

## 2020-05-02 DIAGNOSIS — I110 Hypertensive heart disease with heart failure: Secondary | ICD-10-CM | POA: Diagnosis present

## 2020-05-02 DIAGNOSIS — E785 Hyperlipidemia, unspecified: Secondary | ICD-10-CM | POA: Diagnosis present

## 2020-05-02 DIAGNOSIS — E875 Hyperkalemia: Secondary | ICD-10-CM | POA: Diagnosis not present

## 2020-05-02 DIAGNOSIS — E669 Obesity, unspecified: Secondary | ICD-10-CM | POA: Diagnosis present

## 2020-05-02 DIAGNOSIS — Z6841 Body Mass Index (BMI) 40.0 and over, adult: Secondary | ICD-10-CM | POA: Diagnosis not present

## 2020-05-02 DIAGNOSIS — N179 Acute kidney failure, unspecified: Secondary | ICD-10-CM | POA: Diagnosis present

## 2020-05-02 DIAGNOSIS — F1010 Alcohol abuse, uncomplicated: Secondary | ICD-10-CM | POA: Diagnosis present

## 2020-05-02 LAB — BASIC METABOLIC PANEL
Anion gap: 8 (ref 5–15)
Anion gap: 8 (ref 5–15)
BUN: 55 mg/dL — ABNORMAL HIGH (ref 6–20)
BUN: 55 mg/dL — ABNORMAL HIGH (ref 6–20)
CO2: 24 mmol/L (ref 22–32)
CO2: 25 mmol/L (ref 22–32)
Calcium: 8.2 mg/dL — ABNORMAL LOW (ref 8.9–10.3)
Calcium: 8.5 mg/dL — ABNORMAL LOW (ref 8.9–10.3)
Chloride: 99 mmol/L (ref 98–111)
Chloride: 101 mmol/L (ref 98–111)
Creatinine, Ser: 2.11 mg/dL — ABNORMAL HIGH (ref 0.61–1.24)
Creatinine, Ser: 2.02 mg/dL — ABNORMAL HIGH (ref 0.61–1.24)
GFR, Estimated: 36 mL/min — ABNORMAL LOW (ref >60)
GFR, Estimated: 38 mL/min — ABNORMAL LOW (ref >60)
Glucose, Bld: 225 mg/dL — ABNORMAL HIGH (ref 70–99)
Glucose, Bld: 193 mg/dL — ABNORMAL HIGH (ref 70–99)
Potassium: 5.2 mmol/L — ABNORMAL HIGH (ref 3.5–5.1)
Potassium: 5.2 mmol/L — ABNORMAL HIGH (ref 3.5–5.1)
Sodium: 131 mmol/L — ABNORMAL LOW (ref 135–145)
Sodium: 134 mmol/L — ABNORMAL LOW (ref 135–145)

## 2020-05-02 LAB — GLUCOSE, CAPILLARY
Glucose-Capillary: 193 mg/dL — ABNORMAL HIGH (ref 70–99)
Glucose-Capillary: 277 mg/dL — ABNORMAL HIGH (ref 70–99)
Glucose-Capillary: 391 mg/dL — ABNORMAL HIGH (ref 70–99)
Glucose-Capillary: 452 mg/dL — ABNORMAL HIGH (ref 70–99)

## 2020-05-02 LAB — CBC
HCT: 39.4 % (ref 39.0–52.0)
Hemoglobin: 12.7 g/dL — ABNORMAL LOW (ref 13.0–17.0)
MCH: 31.2 pg (ref 26.0–34.0)
MCHC: 32.2 g/dL (ref 30.0–36.0)
MCV: 96.8 fL (ref 80.0–100.0)
Platelets: 176 10*3/uL (ref 150–400)
RBC: 4.07 MIL/uL — ABNORMAL LOW (ref 4.22–5.81)
RDW: 12.6 % (ref 11.5–15.5)
WBC: 5.3 10*3/uL (ref 4.0–10.5)
nRBC: 0 % (ref 0.0–0.2)

## 2020-05-02 LAB — HIV ANTIBODY (ROUTINE TESTING W REFLEX): HIV Screen 4th Generation wRfx: NONREACTIVE

## 2020-05-02 LAB — CREATININE, URINE, RANDOM: Creatinine, Urine: 67.91 mg/dL

## 2020-05-02 LAB — SODIUM, URINE, RANDOM: Sodium, Ur: 30 mmol/L

## 2020-05-02 LAB — HEMOGLOBIN A1C
Hgb A1c MFr Bld: 7.5 % — ABNORMAL HIGH (ref 4.8–5.6)
Mean Plasma Glucose: 168.55 mg/dL

## 2020-05-02 MED ORDER — INSULIN ASPART 100 UNIT/ML ~~LOC~~ SOLN
10.0000 [IU] | Freq: Once | SUBCUTANEOUS | Status: AC
  Administered 2020-05-02: 23:00:00 10 [IU] via SUBCUTANEOUS

## 2020-05-02 MED ORDER — METHYLPREDNISOLONE SODIUM SUCC 125 MG IJ SOLR
60.0000 mg | Freq: Four times a day (QID) | INTRAMUSCULAR | Status: DC
  Administered 2020-05-02 – 2020-05-03 (×4): 60 mg via INTRAVENOUS
  Filled 2020-05-02 (×4): qty 2

## 2020-05-02 MED ORDER — INSULIN ASPART 100 UNIT/ML ~~LOC~~ SOLN: 10.0000 [IU] | Freq: Three times a day (TID) | SUBCUTANEOUS | Status: DC

## 2020-05-02 MED ORDER — SODIUM CHLORIDE 0.9 % IV SOLN: INTRAVENOUS | Status: DC

## 2020-05-02 NOTE — Progress Notes (Signed)
PROGRESS NOTE    Jack Gonzalez  WCH:[id number] DOB: 11/09/1960 DOA: 05/01/2020 PCP: System, Provider Not In     Brief Narrative:  Jack Gonzalez is a 59 y.o. male with medical history significant of DM, HTN, HLD who presents to the hospital with chief complaint of SOB, chest tightness, wheezing, cough, malaise, hypoxia. She was recently treated for pneumonia several weeks ago. On arrival to the ED, her oxygen saturation was found to be 76% on room air. He had also had COVID+ over 3 months ago.  On examination, he denies any fevers or chest pain or abdominal pain or nausea, vomiting or diarrhea.  He does admit to dry cough.  His 80-month-old granddaughter had tested positive for RSV recently.  He also admits to 240 ounce beer consumption on a daily basis, last use 12/22.   New events last 24 hours / Subjective: No new complaints on examination.  Patient was examined with iPad interpreter at bedside.  Assessment & Plan:   Active Problems:   Viral pneumonia    Acute hypoxemic respiratory failure secondary to RSV -SpO2 76% on room air, now requiring 2L [place] O2. Wean as able -Influenza and COVID negative  -Per EDP, patient tested positive for RSV (I'm unable to view results in epic)  -Solu-Medrol and breathing treatments due to expiratory wheezes  Hypertensive urgency -BP 215/101 in the ED -Improved  AKI -Renal US and urine studies ordered -IVF -Hold chlorthalidone and lisinopril   HLD -Lipitor 80mg  daily   DM -Hold invokamet, ozempic -Ha1c 7.5  -Lantus, SSI  Demand ischemia -Trop 22 --> 19   Alcohol abuse -CIWA, last drink 12/22    DVT prophylaxis: Lovenox   Code Status: Full code Family Communication: No family at bedside Disposition Plan:  Status is: Observation  The patient will require care spanning > 2 midnights and should be moved to inpatient because: IV treatments appropriate due to intensity of illness or inability to take PO  Dispo: The patient is  from: Home              Anticipated d/c is to: Home              Anticipated d/c date is: 2 days              Patient currently is not medically stable to d/c.  Continues to have bilateral wheezing and respiratory failure.  Creatinine increased overnight, start IV fluid and obtain urine studies and renal ultrasound.   Consultants:   None  Procedures:   None  Antimicrobials:  Anti-infectives (From admission, onward)   None        Objective: Vitals:   05/01/20 1800 05/01/20 2056 05/02/20 0424 05/02/20 1012  BP: (!) 160/80 (!) 157/72 134/79   Pulse: 76 95 83   Resp:  15 20   Temp:  97.7 F (36.5 C) 97.6 F (36.4 C)   TempSrc:  Oral Oral   SpO2:  95% 98% 97%  Weight:      Height:        Intake/Output Summary (Last 24 hours) at 05/02/2020 1036 Last data filed at 05/02/2020 1020 Gross per 24 hour  Intake 240 ml  Output --  Net 240 ml   Filed Weights   05/01/20 0948  Weight: 132.5 kg    Examination:   General exam: Appears calm  Respiratory system: Bilateral wheezes and increase in respiratory effort with conversation Cardiovascular system: S1 & S2 heard, RRR. No murmurs. No pedal edema. Gastrointestinal system:  Abdomen is nondistended, soft and nontender. Normal bowel sounds heard. Central nervous system: Alert and oriented. No focal neurological deficits. Speech clear.  Extremities: Symmetric in appearance  Skin: No rashes, lesions or ulcers on exposed skin  Psychiatry: Judgement and insight appear normal. Mood & affect appropriate.   Data Reviewed: I have personally reviewed following labs and imaging studies  CBC: Recent Labs  Lab 05/01/20 1000 05/02/20 0541  WBC 5.1 5.3  NEUTROABS 3.5  --   HGB 13.8 12.7*  HCT 41.5 39.4  MCV 91.6 96.8  PLT 178 176   Basic Metabolic Panel: Recent Labs  Lab 05/01/20 1000 05/02/20 0541 05/02/20 0758  NA 129* 131* 134*  K 4.7 5.2* 5.2*  CL 98 99 101  CO2 23 24 25   GLUCOSE 122* 225* 193*  BUN 29* 55* 55*   CREATININE 1.23 2.11* 2.02*  CALCIUM 8.0* 8.2* 8.5*   GFR: Estimated Creatinine Clearance: 54.6 mL/min (A) (by C-G formula based on SCr of 2.02 mg/dL (H)). Liver Function Tests: Recent Labs  Lab 05/01/20 1000  AST 25  ALT 20  ALKPHOS 114  BILITOT 0.4  PROT 6.7  ALBUMIN 2.8*   No results for input(s): LIPASE, AMYLASE in the last 168 hours. No results for input(s): AMMONIA in the last 168 hours. Coagulation Profile: No results for input(s): INR, PROTIME in the last 168 hours. Cardiac Enzymes: No results for input(s): CKTOTAL, CKMB, CKMBINDEX, TROPONINI in the last 168 hours. BNP (last 3 results) No results for input(s): PROBNP in the last 8760 hours. HbA1C: Recent Labs    05/02/20 0541  HGBA1C 7.5*   CBG: Recent Labs  Lab 05/01/20 1840 05/01/20 2058 05/02/20 0741  GLUCAP 308* 337* 193*   Lipid Profile: No results for input(s): CHOL, HDL, LDLCALC, TRIG, CHOLHDL, LDLDIRECT in the last 72 hours. Thyroid Function Tests: No results for input(s): TSH, T4TOTAL, FREET4, T3FREE, THYROIDAB in the last 72 hours. Anemia Panel: No results for input(s): VITAMINB12, FOLATE, FERRITIN, TIBC, IRON, RETICCTPCT in the last 72 hours. Sepsis Labs: Recent Labs  Lab 05/01/20 1000 05/01/20 1213  LATICACIDVEN 1.0 0.7    Recent Results (from the past 240 hour(s))  Resp Panel by RT-PCR (Flu A&B, Covid) Nasopharyngeal Swab     Status: None   Collection Time: 05/01/20 10:20 AM   Specimen: Nasopharyngeal Swab; Nasopharyngeal(NP) swabs in vial transport medium  Result Value Ref Range Status   SARS Coronavirus 2 by RT PCR NEGATIVE NEGATIVE Final    Comment: (NOTE) SARS-CoV-2 target nucleic acids are NOT DETECTED.  The SARS-CoV-2 RNA is generally detectable in upper respiratory specimens during the acute phase of infection. The lowest concentration of SARS-CoV-2 viral copies this assay can detect is 138 copies/mL. A negative result does not preclude SARS-Cov-2 infection and should not  be used as the sole basis for treatment or other patient management decisions. A negative result may occur with  improper specimen collection/handling, submission of specimen other than nasopharyngeal swab, presence of viral mutation(s) within the areas targeted by this assay, and inadequate number of viral copies(<138 copies/mL). A negative result must be combined with clinical observations, patient history, and epidemiological information. The expected result is Negative.  Fact Sheet for Patients:  05/03/20  Fact Sheet for Healthcare Providers:  BloggerCourse.com  This test is no t yet approved or cleared by the [employer] FDA and  has been authorized for detection and/or diagnosis of SARS-CoV-2 by FDA under an Emergency Use Authorization (EUA). This EUA will remain  in effect (meaning  this test can be used) for the duration of the COVID-19 declaration under Section 564(b)(1) of the Act, 21 U.S.C.section 360bbb-3(b)(1), unless the authorization is terminated  or revoked sooner.       Influenza A by PCR NEGATIVE NEGATIVE Final   Influenza B by PCR NEGATIVE NEGATIVE Final    Comment: (NOTE) The Xpert Xpress SARS-CoV-2/FLU/RSV plus assay is intended as an aid in the diagnosis of influenza from Nasopharyngeal swab specimens and should not be used as a sole basis for treatment. Nasal washings and aspirates are unacceptable for Xpert Xpress SARS-CoV-2/FLU/RSV testing.  Fact Sheet for Patients: BloggerCourse.com  Fact Sheet for Healthcare Providers: SeriousBroker.it  This test is not yet approved or cleared by the [place] FDA and has been authorized for detection and/or diagnosis of SARS-CoV-2 by FDA under an Emergency Use Authorization (EUA). This EUA will remain in effect (meaning this test can be used) for the duration of the COVID-19 declaration under Section  564(b)(1) of the Act, 21 U.S.C. section 360bbb-3(b)(1), unless the authorization is terminated or revoked.  Performed at [facility], [address], [place], [place] [address]       Radiology Studies: CT Angio Chest PE W and/or Wo Contrast  Result Date: 05/01/2020 CLINICAL DATA:  Cough and shortness of breath. Question pulmonary emboli. Low oxygen saturation. EXAM: CT ANGIOGRAPHY CHEST WITH CONTRAST TECHNIQUE: Multidetector CT imaging of the chest was performed using the standard protocol during bolus administration of intravenous contrast. Multiplanar CT image reconstructions and MIPs were obtained to evaluate the vascular anatomy. CONTRAST:  OMNIPAQUE IOHEXOL 350 MG/ML SOLN COMPARISON:  Chest radiography same day FINDINGS: Cardiovascular: Heart size upper limits of normal. Coronary artery calcification is noted. Aortic atherosclerotic calcification is noted. No aneurysm or dissection. Pulmonary arterial opacification is good. There are no pulmonary emboli. Mediastinum/Nodes: No mediastinal or hilar mass or lymphadenopathy. Lungs/Pleura: Bilateral hazy and patchy pulmonary infiltrates, most pronounced in the right upper lobe. Findings are nonspecific and could be due to viral or bacterial pneumonia. No dense consolidation or lobar collapse. No effusion. Upper Abdomen: Negative Musculoskeletal: Ordinary thoracic degenerative changes. Review of the MIP images confirms the above findings. IMPRESSION: 1. No pulmonary emboli. 2. Bilateral hazy and patchy pulmonary infiltrates, most pronounced in the right upper lobe. Findings are nonspecific and could be due to viral or bacterial pneumonia. No dense consolidation or lobar collapse. 3. Aortic atherosclerosis. Coronary artery calcification. Aortic Atherosclerosis (ICD10-I70.0). Electronically Signed   By: Paulina Fusi M.D.   On: 05/01/2020 11:40   DG Chest Portable 1 View  Result Date: 05/01/2020 CLINICAL DATA:  Hypoxia. EXAM:  PORTABLE CHEST 1 VIEW COMPARISON:  None. FINDINGS: Enlarged cardiac silhouette. Pulmonary vascular congestion. No consolidation. No visible pleural effusions or pneumothorax. No acute osseous abnormality. IMPRESSION: Cardiomegaly and pulmonary vascular congestion without overt pulmonary edema. No focal consolidation. Electronically Signed   By: Feliberto Harts MD   On: 05/01/2020 10:30      Scheduled Meds: . atorvastatin  80 mg Oral Daily  . enoxaparin (LOVENOX) injection  65 mg Subcutaneous Q24H  . folic acid  1 mg Oral Daily  . insulin aspart  0-15 Units Subcutaneous TID WC  . insulin aspart  0-5 Units Subcutaneous QHS  . insulin glargine  60 Units Subcutaneous Daily  . methylPREDNISolone (SOLU-MEDROL) injection  60 mg Intravenous Q24H  . multivitamin with minerals  1 tablet Oral Daily  . sodium chloride flush  3 mL Intravenous Q12H  . thiamine  100 mg  Oral Daily   Or  . thiamine  100 mg Intravenous Daily   Continuous Infusions: . sodium chloride    . sodium chloride       LOS: 0 days      Time spent: 25 minutes   Noralee StainJennifer Woodie Degraffenreid, DO [facility] 05/02/2020, 10:36 AM   Available via Epic secure chat 7am-7pm After these hours, please refer to coverage provider listed on [email or web address]

## 2020-05-02 NOTE — Progress Notes (Signed)
MD secure chat sent d/t CBG of 391. MD aware. See new orders.

## 2020-05-03 ENCOUNTER — Inpatient Hospital Stay (HOSPITAL_COMMUNITY): Payer: BC Managed Care – PPO

## 2020-05-03 DIAGNOSIS — J9601 Acute respiratory failure with hypoxia: Secondary | ICD-10-CM | POA: Diagnosis not present

## 2020-05-03 LAB — POTASSIUM
Potassium: 5.6 mmol/L — ABNORMAL HIGH (ref 3.5–5.1)
Potassium: 5.6 mmol/L — ABNORMAL HIGH (ref 3.5–5.1)
Potassium: 5 mmol/L (ref 3.5–5.1)
Potassium: 5.2 mmol/L — ABNORMAL HIGH (ref 3.5–5.1)

## 2020-05-03 LAB — CBC
HCT: 40.8 % (ref 39.0–52.0)
Hemoglobin: 12.9 g/dL — ABNORMAL LOW (ref 13.0–17.0)
MCH: 30.4 pg (ref 26.0–34.0)
MCHC: 31.6 g/dL (ref 30.0–36.0)
MCV: 96 fL (ref 80.0–100.0)
Platelets: 196 10*3/uL (ref 150–400)
RBC: 4.25 MIL/uL (ref 4.22–5.81)
RDW: 12.7 % (ref 11.5–15.5)
WBC: 5 10*3/uL (ref 4.0–10.5)
nRBC: 0 % (ref 0.0–0.2)

## 2020-05-03 LAB — BASIC METABOLIC PANEL
Anion gap: 10 (ref 5–15)
BUN: 64 mg/dL — ABNORMAL HIGH (ref 6–20)
CO2: 22 mmol/L (ref 22–32)
Calcium: 8.1 mg/dL — ABNORMAL LOW (ref 8.9–10.3)
Chloride: 99 mmol/L (ref 98–111)
Creatinine, Ser: 2.35 mg/dL — ABNORMAL HIGH (ref 0.61–1.24)
GFR, Estimated: 31 mL/min — ABNORMAL LOW (ref >60)
Glucose, Bld: 449 mg/dL — ABNORMAL HIGH (ref 70–99)
Potassium: 5.4 mmol/L — ABNORMAL HIGH (ref 3.5–5.1)
Sodium: 131 mmol/L — ABNORMAL LOW (ref 135–145)

## 2020-05-03 LAB — GLUCOSE, CAPILLARY
Glucose-Capillary: 403 mg/dL — ABNORMAL HIGH (ref 70–99)
Glucose-Capillary: 464 mg/dL — ABNORMAL HIGH (ref 70–99)
Glucose-Capillary: 367 mg/dL — ABNORMAL HIGH (ref 70–99)
Glucose-Capillary: 374 mg/dL — ABNORMAL HIGH (ref 70–99)
Glucose-Capillary: 479 mg/dL — ABNORMAL HIGH (ref 70–99)
Glucose-Capillary: 448 mg/dL — ABNORMAL HIGH (ref 70–99)

## 2020-05-03 LAB — MAGNESIUM: Magnesium: 2.7 mg/dL — ABNORMAL HIGH (ref 1.7–2.4)

## 2020-05-03 MED ORDER — METHYLPREDNISOLONE SODIUM SUCC 125 MG IJ SOLR
60.0000 mg | Freq: Two times a day (BID) | INTRAMUSCULAR | Status: DC
  Administered 2020-05-03 – 2020-05-07 (×8): 60 mg via INTRAVENOUS
  Filled 2020-05-03 (×8): qty 2

## 2020-05-03 MED ORDER — INSULIN ASPART 100 UNIT/ML ~~LOC~~ SOLN
30.0000 [IU] | Freq: Once | SUBCUTANEOUS | Status: AC
  Administered 2020-05-03: 19:00:00 30 [IU] via SUBCUTANEOUS

## 2020-05-03 MED ORDER — SODIUM CHLORIDE 0.9 % IV SOLN
500.0000 mg | INTRAVENOUS | Status: DC
  Administered 2020-05-03 – 2020-05-07 (×4): 500 mg via INTRAVENOUS
  Filled 2020-05-03 (×5): qty 500

## 2020-05-03 MED ORDER — INSULIN GLARGINE 100 UNIT/ML ~~LOC~~ SOLN
70.0000 [IU] | Freq: Every day | SUBCUTANEOUS | Status: DC
  Administered 2020-05-04 – 2020-05-05 (×2): 70 [IU] via SUBCUTANEOUS
  Filled 2020-05-03 (×3): qty 0.7

## 2020-05-03 MED ORDER — AMLODIPINE BESYLATE 10 MG PO TABS
10.0000 mg | ORAL_TABLET | Freq: Every day | ORAL | Status: DC
  Administered 2020-05-03 – 2020-05-07 (×5): 10 mg via ORAL
  Filled 2020-05-03 (×5): qty 1

## 2020-05-03 MED ORDER — FUROSEMIDE 10 MG/ML IJ SOLN
20.0000 mg | Freq: Once | INTRAMUSCULAR | Status: AC
  Administered 2020-05-03: 15:00:00 20 mg via INTRAVENOUS
  Filled 2020-05-03: qty 2

## 2020-05-03 MED ORDER — INSULIN ASPART 100 UNIT/ML ~~LOC~~ SOLN
0.0000 [IU] | SUBCUTANEOUS | Status: DC
  Administered 2020-05-03: 21:00:00 20 [IU] via SUBCUTANEOUS
  Administered 2020-05-04: 13:00:00 7 [IU] via SUBCUTANEOUS
  Administered 2020-05-04: 18:00:00 11 [IU] via SUBCUTANEOUS
  Administered 2020-05-04 (×3): 7 [IU] via SUBCUTANEOUS
  Administered 2020-05-04: 20 [IU] via SUBCUTANEOUS
  Administered 2020-05-05: 08:00:00 3 [IU] via SUBCUTANEOUS
  Administered 2020-05-05: 04:00:00 4 [IU] via SUBCUTANEOUS
  Administered 2020-05-05: 12:00:00 7 [IU] via SUBCUTANEOUS
  Administered 2020-05-05: 01:00:00 4 [IU] via SUBCUTANEOUS
  Administered 2020-05-05 (×2): 11 [IU] via SUBCUTANEOUS
  Administered 2020-05-06: 01:00:00 4 [IU] via SUBCUTANEOUS
  Administered 2020-05-06: 13:00:00 15 [IU] via SUBCUTANEOUS
  Administered 2020-05-06: 17:00:00 4 [IU] via SUBCUTANEOUS
  Administered 2020-05-06: 05:00:00 3 [IU] via SUBCUTANEOUS
  Administered 2020-05-07: 13:00:00 4 [IU] via SUBCUTANEOUS

## 2020-05-03 MED ORDER — DEXTROSE 5 % IV SOLN: 250.0000 mg | INTRAVENOUS | Status: DC

## 2020-05-03 MED ORDER — CEFTRIAXONE SODIUM 1 G IJ SOLR
1.0000 g | INTRAMUSCULAR | Status: AC
  Administered 2020-05-03 – 2020-05-07 (×5): 1 g via INTRAVENOUS
  Filled 2020-05-03 (×5): qty 1

## 2020-05-03 MED ORDER — INSULIN ASPART 100 UNIT/ML ~~LOC~~ SOLN
15.0000 [IU] | Freq: Three times a day (TID) | SUBCUTANEOUS | Status: DC
  Administered 2020-05-03 – 2020-05-06 (×10): 15 [IU] via SUBCUTANEOUS

## 2020-05-03 MED ORDER — ALBUTEROL SULFATE HFA 108 (90 BASE) MCG/ACT IN AERS
2.0000 | INHALATION_SPRAY | RESPIRATORY_TRACT | Status: DC | PRN
  Administered 2020-05-03: 20:00:00 2 via RESPIRATORY_TRACT
  Filled 2020-05-03: qty 6.7

## 2020-05-03 MED ORDER — SODIUM ZIRCONIUM CYCLOSILICATE 10 G PO PACK
10.0000 g | PACK | Freq: Once | ORAL | Status: AC
  Administered 2020-05-03: 15:00:00 10 g via ORAL
  Filled 2020-05-03: qty 1

## 2020-05-03 MED ORDER — INSULIN ASPART 100 UNIT/ML ~~LOC~~ SOLN
20.0000 [IU] | Freq: Once | SUBCUTANEOUS | Status: AC
  Administered 2020-05-03: 06:00:00 20 [IU] via SUBCUTANEOUS

## 2020-05-03 MED ORDER — ALBUTEROL SULFATE (2.5 MG/3ML) 0.083% IN NEBU
10.0000 mg | INHALATION_SOLUTION | Freq: Once | RESPIRATORY_TRACT | Status: AC
  Administered 2020-05-03: 14:00:00 10 mg via RESPIRATORY_TRACT
  Filled 2020-05-03: qty 12

## 2020-05-03 MED ORDER — MOMETASONE FURO-FORMOTEROL FUM 200-5 MCG/ACT IN AERO
2.0000 | INHALATION_SPRAY | Freq: Two times a day (BID) | RESPIRATORY_TRACT | Status: DC
  Administered 2020-05-03 – 2020-05-07 (×8): 2 via RESPIRATORY_TRACT
  Filled 2020-05-03: qty 8.8

## 2020-05-03 MED ORDER — SODIUM ZIRCONIUM CYCLOSILICATE 10 G PO PACK
10.0000 g | PACK | Freq: Once | ORAL | Status: AC
  Administered 2020-05-03: 09:00:00 10 g via ORAL
  Filled 2020-05-03: qty 1

## 2020-05-03 MED ORDER — INSULIN ASPART 100 UNIT/ML ~~LOC~~ SOLN: 0.0000 [IU] | Freq: Three times a day (TID) | SUBCUTANEOUS | Status: DC

## 2020-05-03 MED ORDER — ALBUTEROL SULFATE HFA 108 (90 BASE) MCG/ACT IN AERS
2.0000 | INHALATION_SPRAY | Freq: Three times a day (TID) | RESPIRATORY_TRACT | Status: DC
  Administered 2020-05-04: 12:00:00 2 via RESPIRATORY_TRACT
  Filled 2020-05-03: qty 6.7

## 2020-05-03 NOTE — Progress Notes (Signed)
The RN call for a breathing tx. RT gave the Pt a tx and the pt had no improvement. The Pt may need an xray and lasix.RT will continue to monitor

## 2020-05-03 NOTE — Progress Notes (Signed)
MD on call notified of patient's CBG of 452, new orders placed. See MAR.  Update: MD notified again due to patient's CBG being still in the 400s, new orders placed.

## 2020-05-03 NOTE — Progress Notes (Signed)
Patient blood glucose was 448, 20u Novolog administered per SS and MD notified. Ordered to recheck blood sugar at 2am.

## 2020-05-03 NOTE — Progress Notes (Signed)
Rt notified RN that the pt didn't respond to the tx and may need an xray or lasix

## 2020-05-03 NOTE — Progress Notes (Signed)
PROGRESS NOTE    Jack Gonzalez  RWE:[id number] DOB: [birth year] DOA: 05/01/2020 PCP: System, Provider Not In     Brief Narrative:  Jack Gonzalez is a 59 y.o. male with medical history significant of DM, HTN, HLD who presents to the hospital with chief complaint of SOB, chest tightness, wheezing, cough, malaise, hypoxia. She was recently treated for pneumonia several weeks ago. On arrival to the ED, her oxygen saturation was found to be 76% on room air. He had also had COVID+ over 3 months ago.  On examination, he denies any fevers or chest pain or abdominal pain or nausea, vomiting or diarrhea.  He does admit to dry cough.  His 69-month-old granddaughter had tested positive for RSV recently.  He also admits to 240 ounce beer consumption on a daily basis, last use 12/22.   New events last 24 hours / Subjective: Patient examined with iPad interpreter at bedside.  He reports that he is feeling better, no coughing.  Tolerating p.o. well without any issues.  Assessment & Plan:   Active Problems:   Viral pneumonia   Acute hypoxemic respiratory failure (HCC)    Acute hypoxemic respiratory failure secondary to RSV -SpO2 76% on room air, now requiring 2L [place] O2. Wean as able -Influenza and COVID negative  -Per EDP, patient tested positive for RSV (I'm unable to view results in epic)  -Solu-Medrol and breathing treatments due to expiratory wheezes  Hypertensive urgency -BP 215/101 in the ED -Elevated BP today, holding chlorthalidone and lisinopril due to AKI. Start norvasc   AKI -Renal US unremarkable -FeNa 0.67% indicating prerenal etiology  -IVF -Hold chlorthalidone and lisinopril  -Trend BMP   Mild hyperkalemia -Lokelma, trend   HLD -Lipitor 80mg  daily   DM -Hold invokamet, ozempic -Ha1c 7.5  -Lantus, SSI - increase dose today due to hyperglycemia while on IV steroids  Demand ischemia -Trop 22 --> 19   Alcohol abuse -CIWA, last drink 12/22    DVT prophylaxis:  Lovenox   Code Status: Full code Family Communication: No family at bedside Disposition Plan:  Status is: Inpatient  Remains inpatient appropriate because:IV treatments appropriate due to intensity of illness or inability to take PO   Dispo: The patient is from: Home              Anticipated d/c is to: Home              Anticipated d/c date is: 2 days              Patient currently is not medically stable to d/c.  Continues to have rhonchorous breath sounds, continue IV steroids, breathing treatments.  Creatinine continues to increase, IV fluids to continue.    Consultants:   None  Procedures:   None  Antimicrobials:  Anti-infectives (From admission, onward)   None       Objective: Vitals:   05/02/20 1436 05/02/20 2200 05/03/20 0005 05/03/20 0620  BP: (!) 183/83 (!) 181/77 (!) 154/75 (!) 178/67  Pulse:  84 93 90  Resp:  18  18  Temp:  98.1 F (36.7 C)  98.1 F (36.7 C)  TempSrc:  Oral    SpO2:  95% 96% 98%  Weight:      Height:        Intake/Output Summary (Last 24 hours) at 05/03/2020 0948 Last data filed at 05/03/2020 0600 Gross per 24 hour  Intake 2937.14 ml  Output 1100 ml  Net 1837.14 ml   Filed Weights   05/01/20  9794  Weight: 132.5 kg    Examination: General exam: Appears calm and comfortable  Respiratory system: Bilateral rhonchi Cardiovascular system: S1 & S2 heard, RRR. No pedal edema. Gastrointestinal system: Abdomen is nondistended, soft and nontender. Normal bowel sounds heard. Central nervous system: Alert and oriented. Non focal exam. Speech clear  Extremities: Symmetric in appearance bilaterally  Skin: No rashes, lesions or ulcers on exposed skin  Psychiatry: Judgement and insight appear stable. Mood & affect appropriate.    Data Reviewed: I have personally reviewed following labs and imaging studies  CBC: Recent Labs  Lab 05/01/20 1000 05/02/20 0541 05/03/20 0410  WBC 5.1 5.3 5.0  NEUTROABS 3.5  --   --   HGB 13.8 12.7*  12.9*  HCT 41.5 39.4 40.8  MCV 91.6 96.8 96.0  PLT 178 176 196   Basic Metabolic Panel: Recent Labs  Lab 05/01/20 1000 05/02/20 0541 05/02/20 0758 05/03/20 0410  NA 129* 131* 134* 131*  K 4.7 5.2* 5.2* 5.4*  CL 98 99 101 99  CO2 23 24 25 22   GLUCOSE 122* 225* 193* 449*  BUN 29* 55* 55* 64*  CREATININE 1.23 2.11* 2.02* 2.35*  CALCIUM 8.0* 8.2* 8.5* 8.1*  MG  --   --   --  2.7*   GFR: Estimated Creatinine Clearance: 46.9 mL/min (A) (by C-G formula based on SCr of 2.35 mg/dL (H)). Liver Function Tests: Recent Labs  Lab 05/01/20 1000  AST 25  ALT 20  ALKPHOS 114  BILITOT 0.4  PROT 6.7  ALBUMIN 2.8*   No results for input(s): LIPASE, AMYLASE in the last 168 hours. No results for input(s): AMMONIA in the last 168 hours. Coagulation Profile: No results for input(s): INR, PROTIME in the last 168 hours. Cardiac Enzymes: No results for input(s): CKTOTAL, CKMB, CKMBINDEX, TROPONINI in the last 168 hours. BNP (last 3 results) No results for input(s): PROBNP in the last 8760 hours. HbA1C: Recent Labs    05/02/20 0541  HGBA1C 7.5*   CBG: Recent Labs  Lab 05/02/20 1654 05/02/20 2202 05/03/20 0336 05/03/20 0342 05/03/20 0734  GLUCAP 391* 452* 464* 403* 367*   Lipid Profile: No results for input(s): CHOL, HDL, LDLCALC, TRIG, CHOLHDL, LDLDIRECT in the last 72 hours. Thyroid Function Tests: No results for input(s): TSH, T4TOTAL, FREET4, T3FREE, THYROIDAB in the last 72 hours. Anemia Panel: No results for input(s): VITAMINB12, FOLATE, FERRITIN, TIBC, IRON, RETICCTPCT in the last 72 hours. Sepsis Labs: Recent Labs  Lab 05/01/20 1000 05/01/20 1213  LATICACIDVEN 1.0 0.7    Recent Results (from the past 240 hour(s))  Blood culture (routine x 2)     Status: None (Preliminary result)   Collection Time: 05/01/20 10:00 AM   Specimen: BLOOD  Result Value Ref Range Status   Specimen Description   Final    BLOOD LEFT ANTECUBITAL Performed at [facility],  [address] [place], [place]    Special Requests   Final    BOTTLES DRAWN AEROBIC AND ANAEROBIC Blood Culture adequate volume Performed at [facility], [address], [place], [place]    Culture   Final    NO GROWTH 1 DAY Performed at [facility], [address], [place], [place]    Report Status PENDING  Incomplete  Resp Panel by RT-PCR (Flu A&B, Covid) Nasopharyngeal Swab     Status: None   Collection Time: 05/01/20 10:20 AM   Specimen: Nasopharyngeal Swab; Nasopharyngeal(NP) swabs in  vial transport medium  Result Value Ref Range Status   SARS Coronavirus 2 by RT PCR NEGATIVE NEGATIVE Final    Comment: (NOTE) SARS-CoV-2 target nucleic acids are NOT DETECTED.  The SARS-CoV-2 RNA is generally detectable in upper respiratory specimens during the acute phase of infection. The lowest concentration of SARS-CoV-2 viral copies this assay can detect is 138 copies/mL. A negative result does not preclude SARS-Cov-2 infection and should not be used as the sole basis for treatment or other patient management decisions. A negative result may occur with  improper specimen collection/handling, submission of specimen other than nasopharyngeal swab, presence of viral mutation(s) within the areas targeted by this assay, and inadequate number of viral copies(<138 copies/mL). A negative result must be combined with clinical observations, patient history, and epidemiological information. The expected result is Negative.  Fact Sheet for Patients:  BloggerCourse.comhttps://www.fda.gov/media/152166/download  Fact Sheet for Healthcare Providers:  SeriousBroker.ithttps://www.fda.gov/media/152162/download  This test is no t yet approved or cleared by the [place] FDA and  has been authorized for detection and/or diagnosis of SARS-CoV-2 by FDA under an Emergency Use Authorization (EUA). This EUA will remain  in effect (meaning this test can be used) for the duration of  the COVID-19 declaration under Section 564(b)(1) of the Act, 21 U.S.C.section 360bbb-3(b)(1), unless the authorization is terminated  or revoked sooner.       Influenza A by PCR NEGATIVE NEGATIVE Final   Influenza B by PCR NEGATIVE NEGATIVE Final    Comment: (NOTE) The Xpert Xpress SARS-CoV-2/FLU/RSV plus assay is intended as an aid in the diagnosis of influenza from Nasopharyngeal swab specimens and should not be used as a sole basis for treatment. Nasal washings and aspirates are unacceptable for Xpert Xpress SARS-CoV-2/FLU/RSV testing.  Fact Sheet for Patients: BloggerCourse.comhttps://www.fda.gov/media/152166/download  Fact Sheet for Healthcare Providers: SeriousBroker.ithttps://www.fda.gov/media/152162/download  This test is not yet approved or cleared by the [place] FDA and has been authorized for detection and/or diagnosis of SARS-CoV-2 by FDA under an Emergency Use Authorization (EUA). This EUA will remain in effect (meaning this test can be used) for the duration of the COVID-19 declaration under Section 564(b)(1) of the Act, 21 U.S.C. section 360bbb-3(b)(1), unless the authorization is terminated or revoked.  Performed at [facility] [place], [address] [place], [place] [address]   Blood culture (routine x 2)     Status: None (Preliminary result)   Collection Time: 05/01/20 11:30 AM   Specimen: BLOOD  Result Value Ref Range Status   Specimen Description   Final    BLOOD BLOOD LEFT FOREARM Performed at [facility] [place], [address] [place], [place] [address]    Special Requests   Final    BOTTLES DRAWN AEROBIC AND ANAEROBIC Blood Culture adequate volume Performed at [facility] [place], [address] [place], [place] [address]    Culture   Final    NO GROWTH 1 DAY Performed at [facility], [address] [place], [place] [address]    Report Status PENDING  Incomplete      Radiology Studies: CT Angio Chest PE W and/or Wo Contrast  Result Date:  05/01/2020 CLINICAL DATA:  Cough and shortness of breath. Question pulmonary emboli. Low oxygen saturation. EXAM: CT ANGIOGRAPHY CHEST WITH CONTRAST TECHNIQUE: Multidetector CT imaging of the chest was performed using the standard protocol during bolus administration of intravenous contrast. Multiplanar CT image reconstructions and MIPs were obtained to evaluate the vascular anatomy. CONTRAST:  100mL OMNIPAQUE IOHEXOL  350 MG/ML SOLN COMPARISON:  Chest radiography same day FINDINGS: Cardiovascular: Heart size upper limits of normal. Coronary artery calcification is noted. Aortic atherosclerotic calcification is noted. No aneurysm or dissection. Pulmonary arterial opacification is good. There are no pulmonary emboli. Mediastinum/Nodes: No mediastinal or hilar mass or lymphadenopathy. Lungs/Pleura: Bilateral hazy and patchy pulmonary infiltrates, most pronounced in the right upper lobe. Findings are nonspecific and could be due to viral or bacterial pneumonia. No dense consolidation or lobar collapse. No effusion. Upper Abdomen: Negative Musculoskeletal: Ordinary thoracic degenerative changes. Review of the MIP images confirms the above findings. IMPRESSION: 1. No pulmonary emboli. 2. Bilateral hazy and patchy pulmonary infiltrates, most pronounced in the right upper lobe. Findings are nonspecific and could be due to viral or bacterial pneumonia. No dense consolidation or lobar collapse. 3. Aortic atherosclerosis. Coronary artery calcification. Aortic Atherosclerosis (ICD10-I70.0). Electronically Signed   By: Paulina Fusi M.D.   On: 05/01/2020 11:40   US RENAL  Result Date: 05/02/2020 CLINICAL DATA:  Acute renal injury EXAM: RENAL / URINARY TRACT ULTRASOUND COMPLETE COMPARISON:  11/27/2015 FINDINGS: Right Kidney: Renal measurements: 12.6 x 6.2 x 5.2 cm. = volume: 213 mL. Echogenicity within normal limits. No mass or hydronephrosis visualized. Left Kidney: Renal measurements: 14.3 x 6.8 x 6.6 cm. = volume: 332 mL.  Echogenicity within normal limits. No mass or hydronephrosis visualized. Bladder: Appears normal for degree of bladder distention. Other: None. IMPRESSION: No acute abnormality noted. Electronically Signed   By: Alcide Clever M.D.   On: 05/02/2020 11:50   DG Chest Portable 1 View  Result Date: 05/01/2020 CLINICAL DATA:  Hypoxia. EXAM: PORTABLE CHEST 1 VIEW COMPARISON:  None. FINDINGS: Enlarged cardiac silhouette. Pulmonary vascular congestion. No consolidation. No visible pleural effusions or pneumothorax. No acute osseous abnormality. IMPRESSION: Cardiomegaly and pulmonary vascular congestion without overt pulmonary edema. No focal consolidation. Electronically Signed   By: Feliberto Harts MD   On: 05/01/2020 10:30      Scheduled Meds: . atorvastatin  80 mg Oral Daily  . enoxaparin (LOVENOX) injection  65 mg Subcutaneous Q24H  . folic acid  1 mg Oral Daily  . insulin aspart  0-20 Units Subcutaneous Q4H  . insulin aspart  15 Units Subcutaneous TID WC  . insulin glargine  60 Units Subcutaneous Daily  . methylPREDNISolone (SOLU-MEDROL) injection  60 mg Intravenous Q6H  . multivitamin with minerals  1 tablet Oral Daily  . sodium chloride flush  3 mL Intravenous Q12H  . thiamine  100 mg Oral Daily   Or  . thiamine  100 mg Intravenous Daily   Continuous Infusions: . sodium chloride    . sodium chloride 125 mL/hr at 05/03/20 0800     LOS: 1 day      Time spent: 25 minutes   Noralee Stain, DO [facility] 05/03/2020, 9:48 AM   Available via Epic secure chat 7am-7pm After these hours, please refer to coverage provider listed on [email or web address]

## 2020-05-03 NOTE — Progress Notes (Signed)
Prn was given at 1245. RT thought medicine was scanned but does not see it in the mar. 1245 albuterol was given

## 2020-05-03 NOTE — Progress Notes (Signed)
Pt fell asleep while RT was giving him a continuous neb and he snored and had apneic spells. Pt would probably benefit from a sleep study.

## 2020-05-04 ENCOUNTER — Inpatient Hospital Stay (HOSPITAL_COMMUNITY): Payer: BC Managed Care – PPO

## 2020-05-04 DIAGNOSIS — I350 Nonrheumatic aortic (valve) stenosis: Secondary | ICD-10-CM

## 2020-05-04 DIAGNOSIS — I361 Nonrheumatic tricuspid (valve) insufficiency: Secondary | ICD-10-CM | POA: Diagnosis not present

## 2020-05-04 DIAGNOSIS — I340 Nonrheumatic mitral (valve) insufficiency: Secondary | ICD-10-CM

## 2020-05-04 DIAGNOSIS — R0603 Acute respiratory distress: Secondary | ICD-10-CM | POA: Diagnosis not present

## 2020-05-04 DIAGNOSIS — J9601 Acute respiratory failure with hypoxia: Secondary | ICD-10-CM | POA: Diagnosis not present

## 2020-05-04 LAB — BASIC METABOLIC PANEL
Anion gap: 8 (ref 5–15)
BUN: 76 mg/dL — ABNORMAL HIGH (ref 6–20)
CO2: 22 mmol/L (ref 22–32)
Calcium: 7.9 mg/dL — ABNORMAL LOW (ref 8.9–10.3)
Chloride: 102 mmol/L (ref 98–111)
Creatinine, Ser: 3.37 mg/dL — ABNORMAL HIGH (ref 0.61–1.24)
GFR, Estimated: 20 mL/min — ABNORMAL LOW (ref >60)
Glucose, Bld: 224 mg/dL — ABNORMAL HIGH (ref 70–99)
Potassium: 5.1 mmol/L (ref 3.5–5.1)
Sodium: 132 mmol/L — ABNORMAL LOW (ref 135–145)

## 2020-05-04 LAB — ECHOCARDIOGRAM COMPLETE
AR max vel: 3.29 cm2
AV Area VTI: 3.58 cm2
AV Area mean vel: 3.46 cm2
AV Mean grad: 10 mmHg
AV Peak grad: 15.5 mmHg
Ao pk vel: 1.97 m/s
Area-P 1/2: 3.99 cm2
Height: 70 in
S' Lateral: 3.7 cm
Weight: 4672 oz

## 2020-05-04 LAB — GLUCOSE, CAPILLARY
Glucose-Capillary: 188 mg/dL — ABNORMAL HIGH (ref 70–99)
Glucose-Capillary: 220 mg/dL — ABNORMAL HIGH (ref 70–99)
Glucose-Capillary: 225 mg/dL — ABNORMAL HIGH (ref 70–99)
Glucose-Capillary: 229 mg/dL — ABNORMAL HIGH (ref 70–99)
Glucose-Capillary: 271 mg/dL — ABNORMAL HIGH (ref 70–99)
Glucose-Capillary: 276 mg/dL — ABNORMAL HIGH (ref 70–99)
Glucose-Capillary: 364 mg/dL — ABNORMAL HIGH (ref 70–99)

## 2020-05-04 MED ORDER — PERFLUTREN LIPID MICROSPHERE
1.0000 mL | INTRAVENOUS | Status: AC | PRN
  Administered 2020-05-04: 15:00:00 3 mL via INTRAVENOUS
  Filled 2020-05-04: qty 10

## 2020-05-04 MED ORDER — HEPARIN SODIUM (PORCINE) 5000 UNIT/ML IJ SOLN
5000.0000 [IU] | Freq: Three times a day (TID) | INTRAMUSCULAR | Status: DC
  Administered 2020-05-04 – 2020-05-07 (×10): 5000 [IU] via SUBCUTANEOUS
  Filled 2020-05-04 (×10): qty 1

## 2020-05-04 MED ORDER — IPRATROPIUM-ALBUTEROL 0.5-2.5 (3) MG/3ML IN SOLN
3.0000 mL | Freq: Four times a day (QID) | RESPIRATORY_TRACT | Status: DC
  Administered 2020-05-04 – 2020-05-06 (×9): 3 mL via RESPIRATORY_TRACT
  Filled 2020-05-04 (×10): qty 3

## 2020-05-04 MED ORDER — IPRATROPIUM-ALBUTEROL 0.5-2.5 (3) MG/3ML IN SOLN
3.0000 mL | RESPIRATORY_TRACT | Status: DC | PRN
  Administered 2020-05-04 – 2020-05-05 (×2): 3 mL via RESPIRATORY_TRACT
  Filled 2020-05-04: qty 3

## 2020-05-04 NOTE — Progress Notes (Signed)
PROGRESS NOTE    Jack Gonzalez  GYF:[id number] DOB: 1961-04-16 DOA: 05/01/2020 PCP: Noralee Stain, DO     Brief Narrative:  Jack Gonzalez is a 59 y.o. male with medical history significant of DM, HTN, HLD who presents to the hospital with chief complaint of SOB, chest tightness, wheezing, cough, malaise, hypoxia. She was recently treated for pneumonia several weeks ago. On arrival to the ED, her oxygen saturation was found to be 76% on room air. He had also had COVID+ over 3 months ago.  On examination, he denies any fevers or chest pain or abdominal pain or nausea, vomiting or diarrhea.  He does admit to dry cough.  His 14-month-old granddaughter had tested positive for RSV recently.  He also admits to 2 x 40 ounce beer consumption on a daily basis, last use 12/22.   New events last 24 hours / Subjective: Patient examined with iPad interpreter at bedside.  He reports that he is feeling well, has no complaints.  Has been eating without any issues.  Has been urinating yellow urine without issues.  Wondering when he can go home  Assessment & Plan:   Active Problems:   Viral pneumonia   Acute hypoxemic respiratory failure (HCC)    Acute hypoxemic respiratory failure secondary to RSV, with RLL pneumonia -SpO2 76% on room air, now requiring 2L [place] O2. Wean as able -Influenza and COVID negative  -Per EDP, patient tested positive for RSV (I'm unable to view results in epic) -Chest x-ray obtained 12/25 which revealed diffuse interstitial prominence, increasing patchy airspace opacity in the right lower lobe.  He was started on Rocephin, azithromycin as well as given 1 dose of IV Lasix -Continues to have bilateral wheezes today. Continue IV steroid, breathing tx, dulera, duonebs   Pulmonary edema -Patient given 1 dose of IV Lasix 12/25 -Echocardiogram pending -Holding IVF and IV lasix until echo results   Hypertensive urgency -BP 215/101 in the ED -Elevated BP, holding chlorthalidone and  lisinopril due to AKI. Started norvasc  -Improved  AKI -Baseline creatinine 1.23  -Renal US unremarkable -FeNa 0.67% indicating prerenal etiology  -Hold chlorthalidone and lisinopril, last dose 12/23  -Trend BMP, worsening overnight in setting of IV lasix 12/25  -May need nephro consult if does not improve next 24 hours   Mild hyperkalemia -Resolved  HLD -Continue Lipitor  DM -Hold invokamet, ozempic -Ha1c 7.5  -Lantus, SSI   Demand ischemia -Trop 22 --> 19   Alcohol abuse -CIWA, last drink 12/22    DVT prophylaxis: Lovenox   Code Status: Full code Family Communication: No family at bedside Disposition Plan:  Status is: Inpatient  Remains inpatient appropriate because:IV treatments appropriate due to intensity of illness or inability to take PO   Dispo: The patient is from: Home              Anticipated d/c is to: Home              Anticipated d/c date is: 2 days              Patient currently is not medically stable to d/c.  Continues to have wheezes bilaterally, continue IV steroids and breathing treatments.   Consultants:   None  Procedures:   None  Antimicrobials:  Anti-infectives (From admission, onward)   Start     Dose/Rate Route Frequency Ordered Stop   05/03/20 1600  cefTRIAXone (ROCEPHIN) 1 g in sodium chloride 0.9 % 100 mL IVPB  1 g 200 mL/hr over 30 Minutes Intravenous Every 24 hours 05/03/20 1444     05/03/20 1600  azithromycin (ZITHROMAX) 500 mg in sodium chloride 0.9 % 250 mL IVPB        500 mg 250 mL/hr over 60 Minutes Intravenous Every 24 hours 05/03/20 1453     05/03/20 1530  azithromycin (ZITHROMAX) 250 mg in dextrose 5 % 125 mL IVPB  Status:  Discontinued        250 mg 125 mL/hr over 60 Minutes Intravenous Every 24 hours 05/03/20 1444 05/03/20 1453       Objective: Vitals:   05/03/20 1600 05/03/20 1951 05/03/20 2049 05/04/20 0419  BP:   (!) 167/64 (!) 127/58  Pulse:   87 80  Resp:   18 18  Temp:   98.4 F (36.9  C) 98.3 F (36.8 C)  TempSrc:   Oral Oral  SpO2: 97% 97% 99% 92%  Weight:      Height:        Intake/Output Summary (Last 24 hours) at 05/04/2020 1041 Last data filed at 05/04/2020 0852 Gross per 24 hour  Intake 3333.42 ml  Output 450 ml  Net 2883.42 ml   Filed Weights   05/01/20 0948  Weight: 132.5 kg    Examination: General exam: Appears calm and comfortable  Respiratory system: Bilateral wheezes.  Without any respiratory distress.  Respiratory effort normal. Cardiovascular system: S1 & S2 heard, RRR. No pedal edema. Gastrointestinal system: Abdomen is nondistended, soft and nontender. Normal bowel sounds heard. Central nervous system: Alert and oriented. Non focal exam. Speech clear  Extremities: Symmetric in appearance bilaterally  Skin: No rashes, lesions or ulcers on exposed skin  Psychiatry: Judgement and insight appear stable. Mood & affect appropriate.     Data Reviewed: I have personally reviewed following labs and imaging studies  CBC: Recent Labs  Lab 05/01/20 1000 05/02/20 0541 05/03/20 0410  WBC 5.1 5.3 5.0  NEUTROABS 3.5  --   --   HGB 13.8 12.7* 12.9*  HCT 41.5 39.4 40.8  MCV 91.6 96.8 96.0  PLT 178 176 196   Basic Metabolic Panel: Recent Labs  Lab 05/01/20 1000 05/02/20 0541 05/02/20 0758 05/03/20 0410 05/03/20 1157 05/03/20 1430 05/03/20 1901 05/03/20 2316 05/04/20 0426  NA 129* 131* 134* 131*  --   --   --   --  132*  K 4.7 5.2* 5.2* 5.4* 5.6* 5.6* 5.0 5.2* 5.1  CL 98 99 101 99  --   --   --   --  102  CO2 --   --   --   --  22  GLUCOSE 122* 225* 193* 449*  --   --   --   --  224*  BUN 29* 55* 55* 64*  --   --   --   --  76*  CREATININE 1.23 2.11* 2.02* 2.35*  --   --   --   --  3.37*  CALCIUM 8.0* 8.2* 8.5* 8.1*  --   --   --   --  7.9*  MG  --   --   --  2.7*  --   --   --   --   --    GFR: Estimated Creatinine Clearance: 32.7 mL/min (A) (by C-G formula based on SCr of 3.37 mg/dL (H)). Liver Function  Tests: Recent Labs  Lab 05/01/20 1000  AST 25  ALT 20  ALKPHOS 114  BILITOT 0.4  PROT 6.7  ALBUMIN 2.8*   No results for input(s): LIPASE, AMYLASE in the last 168 hours. No results for input(s): AMMONIA in the last 168 hours. Coagulation Profile: No results for input(s): INR, PROTIME in the last 168 hours. Cardiac Enzymes: No results for input(s): CKTOTAL, CKMB, CKMBINDEX, TROPONINI in the last 168 hours. BNP (last 3 results) No results for input(s): PROBNP in the last 8760 hours. HbA1C: Recent Labs    05/02/20 0541  HGBA1C 7.5*   CBG: Recent Labs  Lab 05/03/20 1657 05/03/20 2048 05/04/20 0006 05/04/20 0416 05/04/20 0744  GLUCAP 479* 448* 364* 225* 229*   Lipid Profile: No results for input(s): CHOL, HDL, LDLCALC, TRIG, CHOLHDL, LDLDIRECT in the last 72 hours. Thyroid Function Tests: No results for input(s): TSH, T4TOTAL, FREET4, T3FREE, THYROIDAB in the last 72 hours. Anemia Panel: No results for input(s): VITAMINB12, FOLATE, FERRITIN, TIBC, IRON, RETICCTPCT in the last 72 hours. Sepsis Labs: Recent Labs  Lab 05/01/20 1000 05/01/20 1213  LATICACIDVEN 1.0 0.7    Recent Results (from the past 240 hour(s))  Blood culture (routine x 2)     Status: None (Preliminary result)   Collection Time: 05/01/20 10:00 AM   Specimen: BLOOD  Result Value Ref Range Status   Specimen Description   Final    BLOOD LEFT ANTECUBITAL Performed at [facility], [address] [place], [place] [address]    Special Requests   Final    BOTTLES DRAWN AEROBIC AND ANAEROBIC Blood Culture adequate volume Performed at [facility], [address] [place], [place] [address]    Culture   Final    NO GROWTH 2 DAYS Performed at [facility], [address] [place], [place] [address]    Report Status PENDING  Incomplete  Resp Panel by RT-PCR (Flu A&B, Covid) Nasopharyngeal Swab     Status: None   Collection Time: 05/01/20 10:20 AM   Specimen:  Nasopharyngeal Swab; Nasopharyngeal(NP) swabs in vial transport medium  Result Value Ref Range Status   SARS Coronavirus 2 by RT PCR NEGATIVE NEGATIVE Final    Comment: (NOTE) SARS-CoV-2 target nucleic acids are NOT DETECTED.  The SARS-CoV-2 RNA is generally detectable in upper respiratory specimens during the acute phase of infection. The lowest concentration of SARS-CoV-2 viral copies this assay can detect is 138 copies/mL. A negative result does not preclude SARS-Cov-2 infection and should not be used as the sole basis for treatment or other patient management decisions. A negative result may occur with  improper specimen collection/handling, submission of specimen other than nasopharyngeal swab, presence of viral mutation(s) within the areas targeted by this assay, and inadequate number of viral copies(<138 copies/mL). A negative result must be combined with clinical observations, patient history, and epidemiological information. The expected result is Negative.  Fact Sheet for Patients:  BloggerCourse.com  Fact Sheet for Healthcare Providers:  SeriousBroker.it  This test is no t yet approved or cleared by the [place] FDA and  has been authorized for detection and/or diagnosis of SARS-CoV-2 by FDA under an Emergency Use Authorization (EUA). This EUA will remain  in effect (meaning this test can be used) for the duration of the COVID-19 declaration under Section 564(b)(1) of the Act, 21 U.S.C.section 360bbb-3(b)(1), unless the authorization is terminated  or revoked sooner.       Influenza A by PCR NEGATIVE NEGATIVE Final   Influenza B by PCR NEGATIVE NEGATIVE Final    Comment: (NOTE) The Xpert Xpress SARS-CoV-2/FLU/RSV  plus assay is intended as an aid in the diagnosis of influenza from Nasopharyngeal swab specimens and should not be used as a sole basis for treatment. Nasal washings and aspirates are unacceptable for  Xpert Xpress SARS-CoV-2/FLU/RSV testing.  Fact Sheet for Patients: BloggerCourse.comhttps://www.fda.gov/media/152166/download  Fact Sheet for Healthcare Providers: SeriousBroker.ithttps://www.fda.gov/media/152162/download  This test is not yet approved or cleared by the [place] FDA and has been authorized for detection and/or diagnosis of SARS-CoV-2 by FDA under an Emergency Use Authorization (EUA). This EUA will remain in effect (meaning this test can be used) for the duration of the COVID-19 declaration under Section 564(b)(1) of the Act, 21 U.S.C. section 360bbb-3(b)(1), unless the authorization is terminated or revoked.  Performed at [facility], [address] [place], [place] [address]   Blood culture (routine x 2)     Status: None (Preliminary result)   Collection Time: 05/01/20 11:30 AM   Specimen: BLOOD  Result Value Ref Range Status   Specimen Description   Final    BLOOD BLOOD LEFT FOREARM Performed at [facility], [address] [place], [place] [address]    Special Requests   Final    BOTTLES DRAWN AEROBIC AND ANAEROBIC Blood Culture adequate volume Performed at [facility], [address] [place], [place] [address]    Culture   Final    NO GROWTH 2 DAYS Performed at [facility], [address] [place], [place] [address]    Report Status PENDING  Incomplete      Radiology Studies: US RENAL  Result Date: 05/02/2020 CLINICAL DATA:  Acute renal injury EXAM: RENAL / URINARY TRACT ULTRASOUND COMPLETE COMPARISON:  11/27/2015 FINDINGS: Right Kidney: Renal measurements: 12.6 x 6.2 x 5.2 cm. = volume: 213 mL. Echogenicity within normal limits. No mass or hydronephrosis visualized. Left Kidney: Renal measurements: 14.3 x 6.8 x 6.6 cm. = volume: 332 mL. Echogenicity within normal limits. No mass or hydronephrosis visualized. Bladder: Appears normal for degree of bladder distention. Other: None. IMPRESSION: No acute abnormality noted. Electronically  Signed   By: Alcide CleverMark  Lukens M.D.   On: 05/02/2020 11:50   DG CHEST PORT 1 VIEW  Result Date: 05/03/2020 CLINICAL DATA:  Shortness of breath EXAM: PORTABLE CHEST 1 VIEW COMPARISON:  05/01/2020 FINDINGS: Stable cardiomegaly. Diffuse interstitial prominence with increasing patchy airspace opacity in the right lower lobe. No large pleural fluid collection. No pneumothorax. IMPRESSION: Diffuse interstitial prominence with increasing patchy airspace opacity in the right lower lobe, concerning for worsening infection or edema. Electronically Signed   By: Duanne GuessNicholas  Plundo D.O.   On: 05/03/2020 14:26      Scheduled Meds: . albuterol  2 puff Inhalation TID  . amLODipine  10 mg Oral Daily  . atorvastatin  80 mg Oral Daily  . enoxaparin (LOVENOX) injection  65 mg Subcutaneous Q24H  . folic acid  1 mg Oral Daily  . insulin aspart  0-20 Units Subcutaneous Q4H  . insulin aspart  15 Units Subcutaneous TID WC  . insulin glargine  70 Units Subcutaneous Daily  . methylPREDNISolone (SOLU-MEDROL) injection  60 mg Intravenous Q12H  . mometasone-formoterol  2 puff Inhalation BID  . multivitamin with minerals  1 tablet Oral Daily  . sodium chloride flush  3 mL Intravenous Q12H  . thiamine  100 mg Oral Daily   Or  . thiamine  100 mg Intravenous Daily   Continuous Infusions: . sodium chloride    . azithromycin 500 mg (  05/03/20 1700)  . cefTRIAXone (ROCEPHIN)  IV 200 mL/hr at 05/03/20 1600     LOS: 2 days      Time spent: 25 minutes   Noralee Stain, DO [facility] 05/04/2020, 10:41 AM   Available via Epic secure chat 7am-7pm After these hours, please refer to coverage provider listed on [email or web address]

## 2020-05-04 NOTE — Progress Notes (Signed)
  Echocardiogram 2D Echocardiogram has been performed.  Tye Savoy 05/04/2020, 3:28 PM

## 2020-05-05 DIAGNOSIS — J9601 Acute respiratory failure with hypoxia: Secondary | ICD-10-CM | POA: Diagnosis not present

## 2020-05-05 LAB — CBC
HCT: 36.4 % — ABNORMAL LOW (ref 39.0–52.0)
Hemoglobin: 11.7 g/dL — ABNORMAL LOW (ref 13.0–17.0)
MCH: 30.5 pg (ref 26.0–34.0)
MCHC: 32.1 g/dL (ref 30.0–36.0)
MCV: 95 fL (ref 80.0–100.0)
Platelets: 170 10*3/uL (ref 150–400)
RBC: 3.83 MIL/uL — ABNORMAL LOW (ref 4.22–5.81)
RDW: 12.5 % (ref 11.5–15.5)
WBC: 6.6 10*3/uL (ref 4.0–10.5)
nRBC: 0 % (ref 0.0–0.2)

## 2020-05-05 LAB — BLOOD CULTURE ID PANEL (REFLEXED) - BCID2

## 2020-05-05 LAB — URINALYSIS, ROUTINE W REFLEX MICROSCOPIC
Bilirubin Urine: NEGATIVE
Glucose, UA: >=500 mg/dL — AB
Hgb urine dipstick: NEGATIVE
Ketones, ur: NEGATIVE mg/dL
Leukocytes,Ua: NEGATIVE
Nitrite: NEGATIVE
Protein, ur: >=300 mg/dL — AB
Specific Gravity, Urine: 1.012 (ref 1.005–1.030)
pH: 5 (ref 5.0–8.0)

## 2020-05-05 LAB — GLUCOSE, CAPILLARY
Glucose-Capillary: 135 mg/dL — ABNORMAL HIGH (ref 70–99)
Glucose-Capillary: 183 mg/dL — ABNORMAL HIGH (ref 70–99)
Glucose-Capillary: 195 mg/dL — ABNORMAL HIGH (ref 70–99)
Glucose-Capillary: 198 mg/dL — ABNORMAL HIGH (ref 70–99)
Glucose-Capillary: 212 mg/dL — ABNORMAL HIGH (ref 70–99)
Glucose-Capillary: 262 mg/dL — ABNORMAL HIGH (ref 70–99)
Glucose-Capillary: 283 mg/dL — ABNORMAL HIGH (ref 70–99)

## 2020-05-05 LAB — BASIC METABOLIC PANEL
Anion gap: 8 (ref 5–15)
BUN: 91 mg/dL — ABNORMAL HIGH (ref 6–20)
CO2: 23 mmol/L (ref 22–32)
Calcium: 8.2 mg/dL — ABNORMAL LOW (ref 8.9–10.3)
Chloride: 102 mmol/L (ref 98–111)
Creatinine, Ser: 3.8 mg/dL — ABNORMAL HIGH (ref 0.61–1.24)
GFR, Estimated: 18 mL/min — ABNORMAL LOW (ref >60)
Glucose, Bld: 155 mg/dL — ABNORMAL HIGH (ref 70–99)
Potassium: 5.2 mmol/L — ABNORMAL HIGH (ref 3.5–5.1)
Sodium: 133 mmol/L — ABNORMAL LOW (ref 135–145)

## 2020-05-05 MED ORDER — FUROSEMIDE 10 MG/ML IJ SOLN
80.0000 mg | Freq: Two times a day (BID) | INTRAMUSCULAR | Status: DC
  Administered 2020-05-05 – 2020-05-07 (×4): 80 mg via INTRAVENOUS
  Filled 2020-05-05 (×4): qty 8

## 2020-05-05 NOTE — Progress Notes (Signed)
PHARMACY - PHYSICIAN COMMUNICATION CRITICAL VALUE ALERT - BLOOD CULTURE IDENTIFICATION (BCID)  Jack Gonzalez is an 59 y.o. male who presented to [facility] on 05/01/2020 with a chief complaint of viral pneumonia secondary to RSV, with RLL  Assessment:  1 anaerobic of 4 bottles with GPC clusters and GPR, nothing identified on BCID  Name of physician (or Provider) Contacted: Dr. Alvino Chapel  Current antibiotics: ceftriaxone/azithromycin   Changes to prescribed antibiotics recommended: will consider as contaminants and repeat blood cultures.   Results for orders placed or performed during the hospital encounter of 05/01/20  Blood Culture ID Panel (Reflexed) (Collected: 05/01/2020 10:00 AM)  Result Value Ref Range   Enterococcus faecalis NOT DETECTED NOT DETECTED   Enterococcus Faecium NOT DETECTED NOT DETECTED   Listeria monocytogenes NOT DETECTED NOT DETECTED   Staphylococcus species NOT DETECTED NOT DETECTED   Staphylococcus aureus (BCID) NOT DETECTED NOT DETECTED   Staphylococcus epidermidis NOT DETECTED NOT DETECTED   Staphylococcus lugdunensis NOT DETECTED NOT DETECTED   Streptococcus species NOT DETECTED NOT DETECTED   Streptococcus agalactiae NOT DETECTED NOT DETECTED   Streptococcus pneumoniae NOT DETECTED NOT DETECTED   Streptococcus pyogenes NOT DETECTED NOT DETECTED   A.calcoaceticus-baumannii NOT DETECTED NOT DETECTED   Bacteroides fragilis NOT DETECTED NOT DETECTED   Enterobacterales NOT DETECTED NOT DETECTED   Enterobacter cloacae complex NOT DETECTED NOT DETECTED   Escherichia coli NOT DETECTED NOT DETECTED   Klebsiella aerogenes NOT DETECTED NOT DETECTED   Klebsiella oxytoca NOT DETECTED NOT DETECTED   Klebsiella pneumoniae NOT DETECTED NOT DETECTED   Proteus species NOT DETECTED NOT DETECTED   Salmonella species NOT DETECTED NOT DETECTED   Serratia marcescens NOT DETECTED NOT DETECTED   Haemophilus influenzae NOT DETECTED NOT DETECTED   Neisseria meningitidis NOT  DETECTED NOT DETECTED   Pseudomonas aeruginosa NOT DETECTED NOT DETECTED   Stenotrophomonas maltophilia NOT DETECTED NOT DETECTED   Candida albicans NOT DETECTED NOT DETECTED   Candida auris NOT DETECTED NOT DETECTED   Candida glabrata NOT DETECTED NOT DETECTED   Candida krusei NOT DETECTED NOT DETECTED   Candida parapsilosis NOT DETECTED NOT DETECTED   Candida tropicalis NOT DETECTED NOT DETECTED   Cryptococcus neoformans/gattii NOT DETECTED NOT DETECTED    Loralee Pacas, PharmD, BCPS Pharmacy: [phone] 05/05/2020  3:10 PM

## 2020-05-05 NOTE — Plan of Care (Signed)

## 2020-05-05 NOTE — Consult Note (Addendum)
Renal Service Consult Note [facility]  Jack Gonzalez 05/05/2020 Maree Krabbe, MD Requesting Physician: Dr Alvino Chapel  Reason for Consult: Renal failure HPI: The patient is a 59 y.o. year-old w/ hx of DM, HTN, HL presented to ED w/ c/o SOB, coughing, wheezing, chest pressure and malaise and hypoxia.  Recently rx'd for PNA.  In ED SpO2 was 76% on RA. SP covid infection about 3 mos ago. No n/v/d. Drinks a lot of beer, about two 40 oz beers daily. CXR showed RLL pNA, ED testing was + for RSV.  Pt was admitted and treated w/ IV rocephin , azithromycin and IV laisx. BP's were high in ED and acei/ thiazide were held and norvasc was started. Creat 1.2 on admit, has been climbing daily up to 3.3 yest and 3.8 today. Asked to see for renal failure.    Pt seen in room, dtr helped to interpret, pt denies sig SOB or chest pain, no n/v/d and no abd pain.  No hx kidney problems.   Pt rec'd IV contrast on 12/23 and was on ACEi at that time, the acei has since been dc'd. No hypotension since admit, BP's have been high.  No other nephrotoxins noted.   ROS  denies CP  no joint pain   no HA  no blurry vision  no rash  no diarrhea  no nausea/ vomiting  no dysuria  no difficulty voiding  no change in urine color    Past Medical History  Past Medical History:  Diagnosis Date  . Diabetes mellitus without complication (HCC)   . Diabetic retinopathy (HCC)   . High cholesterol   . Hypertension    Past Surgical History  Past Surgical History:  Procedure Laterality Date  . NO PAST SURGERIES     Family History  Family History  Problem Relation Age of Onset  . Diabetes Mother   . Hypertension Mother   . Hypertension Sister    Social History  reports that he has never smoked. He has never used smokeless tobacco. He reports current alcohol use. No history on file for drug use. Allergies No Known Allergies Home medications Prior to Admission medications   Medication Sig Start Date End  Date Taking? Authorizing Provider  atorvastatin (LIPITOR) 80 MG tablet Take 80 mg by mouth daily.   Yes [provider]  chlorthalidone (HYGROTON) 25 MG tablet Take 25 mg by mouth daily.   Yes [provider]  insulin glargine (LANTUS) 100 UNIT/ML injection Inject 60 Units into the skin daily.   Yes [provider]  INVOKAMET XR 150-500 MG TB24 Take 1 tablet by mouth daily. 04/15/20  Yes [provider]  lisinopril (ZESTRIL) 40 MG tablet Take 40 mg by mouth daily.   Yes [provider]  metFORMIN (GLUCOPHAGE) 1000 MG tablet Take 1,000 mg by mouth 2 (two) times daily with a meal.   Yes [provider]  OZEMPIC, 0.25 OR 0.5 MG/DOSE, 2 MG/1.5ML SOPN Inject 0.5 mg into the skin once a week. 03/14/20  Yes [provider]  SYMBICORT 160-4.5 MCG/ACT inhaler Inhale 1 puff into the lungs daily as needed (sob/wheezing). 01/18/20  Yes [provider]     Vitals:   05/05/20 0406 05/05/20 0604 05/05/20 1336 05/05/20 1430  BP: (!) 143/84   (!) 162/83  Pulse: 82   85  Resp: 18   19  Temp: 98.4 F (36.9 C)   97.9 F (36.6 C)  TempSrc: Oral   Oral  SpO2:  98% 99% 97% 97%  Weight:      Height:       Exam Gen obese hispanic male on 2 L [place] O2 No rash, cyanosis or gangrene Sclera anicteric, throat clear  No jvd or bruits Chest scattered rales/ rhonchi L> R side bilat RRR no MRG Abd soft ntnd no mass or ascites +bs +abd wall edema GU normal male  MS no joint effusions or deformity Ext 2+ bilat pretib edema, no hip edema Neuro is alert, Ox 3 , nf, no asterixis    Home meds:  - invokamet 150-500 qd/ metformin 1 gm bid/ lantus 60u qd/ semaglutide   - lisinopril 40 qd/ chlorthalidone 25 qd  - lipitor 80 qd  - symbicort qd  - prn's/ vitamins/ supplements    UNa 30, UCr 68      Renal US 12/24 - 12.6/ 14 cm kidneys, no hydro, no ^echo     UA pend     CXR 12/ 25 - RLL early infiltrate     Alb 2.8   AST 25, ALT 20, tbili 0.4          CT angio w/ contrast 12/23 - IMPRESSION: 1. No pulmonary emboli. 2. Bilateral hazy and patchy pulmonary infiltrates, most pronounced in the right upper lobe. Findings are nonspecific and could be due to viral or bacterial pneumonia. No dense consolidation or lobar collapse.       I/O 8.5 L in and 1.6 L out = net + 6.9 L       Weights  132kg admit , peak,  yest , today       Date  Creat  eGFR     12/23 1.23  > 60     12/24 2.11  36     12 /25 2.35  31      12/ 26 3.37  20      12/ 27 3.80  18 ml/min    CXR 12/25 - no edema, RLL infiltrate  Assessment/ Plan: 1. AKI - severe w/ normal creat 1.2 on admit >> now up to 3.8 today. AKI most likely due to acei + contrast (12/23 CTA chest) +/- dehydration.  Acei since dc'd and pt now vol overloaded due to IVF"s. Nonoliguric, making 400- 600 cc UOP per day. Will need supportive care, avoid contrast/ acei/ ARB/ nsaids. Watch for signs of uremia and dialyze if needed. Have d/w pt and his dtr the situation and questions answered.  2. Vol overload/ HTN - getting norvasc here and prn IV meds. Will start IV lasix 80 bid for now given sig vol overload on exam. Get f/u CXR in am.  3. CAP - on IV abx per primary team 4. Obesity 5. IDDM - per primary team 6. Hyperkalemia - renal diet, lokelma as needed      [name]  MD 05/05/2020, 4:49 PM  Recent Labs  Lab 05/03/20 0410 05/05/20 0418  WBC 5.0 6.6  HGB 12.9* 11.7*   Recent Labs  Lab 05/04/20 0426 05/05/20 0418  K 5.1 5.2*  BUN 76* 91*  CREATININE 3.37* 3.80*  CALCIUM 7.9* 8.2*

## 2020-05-05 NOTE — Progress Notes (Addendum)
PROGRESS NOTE    Jack Gonzalez  ZOX:[id number] DOB: Jan 01, 1961 DOA: 05/01/2020 PCP: Noralee Stain, DO     Brief Narrative:  Jack Gonzalez is a 59 y.o. male with medical history significant of DM, HTN, HLD who presents to the hospital with chief complaint of SOB, chest tightness, wheezing, cough, malaise, hypoxia. She was recently treated for pneumonia several weeks ago. On arrival to the ED, her oxygen saturation was found to be 76% on room air. He had also had COVID+ over 3 months ago.  On examination, he denies any fevers or chest pain or abdominal pain or nausea, vomiting or diarrhea.  He does admit to dry cough.  His 24-month-old granddaughter had tested positive for RSV recently.  He also admits to 2 x 40 ounce beer consumption on a daily basis, last use 12/22.   New events last 24 hours / Subjective: Patient examined with iPad interpreter at bedside.  He has no complaints, eating breakfast without complaints this morning. States he has been urinating. Denies SOB or wheezing, although he has audible wheezes on exam.   Assessment & Plan:   Active Problems:   Viral pneumonia   Acute hypoxemic respiratory failure (HCC)    Acute hypoxemic respiratory failure secondary to RSV, with RLL pneumonia -SpO2 76% on room air, now requiring 2L [place] O2. Wean as able -Influenza and COVID negative  -Per EDP, patient tested positive for RSV (I'm unable to view results in epic) -Chest x-ray obtained 12/25 which revealed diffuse interstitial prominence, increasing patchy airspace opacity in the right lower lobe.  He was started on Rocephin, azithromycin as well as given 1 dose of IV Lasix -Continues to have bilateral wheezes today. Continue IV steroid, breathing tx, dulera, duonebs    Acute diastolic HF  -Echocardiogram showed EF 60-65%, grade 2 diastolic dysfunction  -Patient given 1 dose of IV Lasix 12/25 -Holding off on additional lasix due to worsening renal function   Hypertensive urgency -BP  215/101 in the ED -Elevated BP, holding chlorthalidone and lisinopril due to AKI. Started norvasc  -Improved  AKI -Baseline creatinine 1.23  -Renal US unremarkable -FeNa 0.67% indicating prerenal etiology  -Hold chlorthalidone and lisinopril, last dose 12/23, given IV lasix 12/25  -Continues to worsen. Nephro consult   HLD -Continue Lipitor  DM -Hold invokamet, ozempic -Ha1c 7.5  -Lantus, SSI   Demand ischemia -Trop 22 --> 19   Alcohol abuse -Last drink 12/22    DVT prophylaxis: Lovenox   Code Status: Full code Family Communication: No family at bedside; updated daughter over the phone this morning  Disposition Plan:  Status is: Inpatient  Remains inpatient appropriate because:IV treatments appropriate due to intensity of illness or inability to take PO   Dispo: The patient is from: Home              Anticipated d/c is to: Home              Anticipated d/c date is: 2 days              Patient currently is not medically stable to d/c.  Continues to have wheezes bilaterally, continue IV steroids and breathing treatments. Cr worsening, nephrology consulted    Consultants:   Nephrology   Procedures:   None  Antimicrobials:  Anti-infectives (From admission, onward)   Start     Dose/Rate Route Frequency Ordered Stop   05/03/20 1600  cefTRIAXone (ROCEPHIN) 1 g in sodium chloride 0.9 % 100 mL IVPB  1 g 200 mL/hr over 30 Minutes Intravenous Every 24 hours 05/03/20 1444 05/08/20 1559   05/03/20 1600  azithromycin (ZITHROMAX) 500 mg in sodium chloride 0.9 % 250 mL IVPB        500 mg 250 mL/hr over 60 Minutes Intravenous Every 24 hours 05/03/20 1453 05/08/20 1559   05/03/20 1530  azithromycin (ZITHROMAX) 250 mg in dextrose 5 % 125 mL IVPB  Status:  Discontinued        250 mg 125 mL/hr over 60 Minutes Intravenous Every 24 hours 05/03/20 1444 05/03/20 1453       Objective: Vitals:   05/04/20 2009 05/05/20 0218 05/05/20 0406 05/05/20 0604  BP:   (!)  143/84   Pulse:   82   Resp:   18   Temp:   98.4 F (36.9 C)   TempSrc:   Oral   SpO2: 98% 99% 98% 99%  Weight:      Height:        Intake/Output Summary (Last 24 hours) at 05/05/2020 0927 Last data filed at 05/05/2020 0541 Gross per 24 hour  Intake 713 ml  Output --  Net 713 ml   Filed Weights   05/01/20 0948  Weight: 132.5 kg    Examination: General exam: Appears calm and comfortable  Respiratory system: Bilateral wheezes  Cardiovascular system: S1 & S2 heard, RRR. No pedal edema. Gastrointestinal system: Abdomen is nondistended, soft and nontender. Normal bowel sounds heard. Central nervous system: Alert and oriented. Non focal exam. Speech clear  Extremities: Symmetric in appearance bilaterally  Skin: No rashes, lesions or ulcers on exposed skin  Psychiatry: Judgement and insight appear stable. Mood & affect appropriate.    Data Reviewed: I have personally reviewed following labs and imaging studies  CBC: Recent Labs  Lab 05/01/20 1000 05/02/20 0541 05/03/20 0410 05/05/20 0418  WBC 5.1 5.3 5.0 6.6  NEUTROABS 3.5  --   --   --   HGB 13.8 12.7* 12.9* 11.7*  HCT 41.5 39.4 40.8 36.4*  MCV 91.6 96.8 96.0 95.0  PLT 178 176 196 170   Basic Metabolic Panel: Recent Labs  Lab 05/02/20 0541 05/02/20 0758 05/03/20 0410 05/03/20 1157 05/03/20 1430 05/03/20 1901 05/03/20 2316 05/04/20 0426 05/05/20 0418  NA 131* 134* 131*  --   --   --   --  132* 133*  K 5.2* 5.2* 5.4*   < > 5.6* 5.0 5.2* 5.1 5.2*  CL 99 101 99  --   --   --   --  102 102  CO2 --   --   --   --  22 23  GLUCOSE 225* 193* 449*  --   --   --   --  224* 155*  BUN 55* 55* 64*  --   --   --   --  76* 91*  CREATININE 2.11* 2.02* 2.35*  --   --   --   --  3.37* 3.80*  CALCIUM 8.2* 8.5* 8.1*  --   --   --   --  7.9* 8.2*  MG  --   --  2.7*  --   --   --   --   --   --    < > = values in this interval not displayed.   GFR: Estimated Creatinine Clearance: 29 mL/min (A) (by C-G formula  based on SCr of 3.8 mg/dL (H)). Liver Function Tests: Recent Labs  Lab 05/01/20 1000  AST  25  ALT 20  ALKPHOS 114  BILITOT 0.4  PROT 6.7  ALBUMIN 2.8*   No results for input(s): LIPASE, AMYLASE in the last 168 hours. No results for input(s): AMMONIA in the last 168 hours. Coagulation Profile: No results for input(s): INR, PROTIME in the last 168 hours. Cardiac Enzymes: No results for input(s): CKTOTAL, CKMB, CKMBINDEX, TROPONINI in the last 168 hours. BNP (last 3 results) No results for input(s): PROBNP in the last 8760 hours. HbA1C: No results for input(s): HGBA1C in the last 72 hours. CBG: Recent Labs  Lab 05/04/20 1727 05/04/20 2033 05/05/20 0019 05/05/20 0404 05/05/20 0738  GLUCAP 276* 271* 183* 198* 135*   Lipid Profile: No results for input(s): CHOL, HDL, LDLCALC, TRIG, CHOLHDL, LDLDIRECT in the last 72 hours. Thyroid Function Tests: No results for input(s): TSH, T4TOTAL, FREET4, T3FREE, THYROIDAB in the last 72 hours. Anemia Panel: No results for input(s): VITAMINB12, FOLATE, FERRITIN, TIBC, IRON, RETICCTPCT in the last 72 hours. Sepsis Labs: Recent Labs  Lab 05/01/20 1000 05/01/20 1213  LATICACIDVEN 1.0 0.7    Recent Results (from the past 240 hour(s))  Blood culture (routine x 2)     Status: None (Preliminary result)   Collection Time: 05/01/20 10:00 AM   Specimen: BLOOD  Result Value Ref Range Status   Specimen Description   Final    BLOOD LEFT ANTECUBITAL Performed at [facility], [address] [place], [place] [address]    Special Requests   Final    BOTTLES DRAWN AEROBIC AND ANAEROBIC Blood Culture adequate volume Performed at [facility], [address] [place], [place] [address]    Culture  Setup Time PENDING  Incomplete   Culture   Final    NO GROWTH 4 DAYS Performed at [facility], [address], [place], [place] [address]    Report Status PENDING  Incomplete  Resp Panel by RT-PCR (Flu A&B, Covid)  Nasopharyngeal Swab     Status: None   Collection Time: 05/01/20 10:20 AM   Specimen: Nasopharyngeal Swab; Nasopharyngeal(NP) swabs in vial transport medium  Result Value Ref Range Status   SARS Coronavirus 2 by RT PCR NEGATIVE NEGATIVE Final    Comment: (NOTE) SARS-CoV-2 target nucleic acids are NOT DETECTED.  The SARS-CoV-2 RNA is generally detectable in upper respiratory specimens during the acute phase of infection. The lowest concentration of SARS-CoV-2 viral copies this assay can detect is 138 copies/mL. A negative result does not preclude SARS-Cov-2 infection and should not be used as the sole basis for treatment or other patient management decisions. A negative result may occur with  improper specimen collection/handling, submission of specimen other than nasopharyngeal swab, presence of viral mutation(s) within the areas targeted by this assay, and inadequate number of viral copies(<138 copies/mL). A negative result must be combined with clinical observations, patient history, and epidemiological information. The expected result is Negative.  Fact Sheet for Patients:  BloggerCourse.com  Fact Sheet for Healthcare Providers:  SeriousBroker.it  This test is no t yet approved or cleared by the [place] FDA and  has been authorized for detection and/or diagnosis of SARS-CoV-2 by FDA under an Emergency Use Authorization (EUA). This EUA will remain  in effect (meaning this test can be used) for the duration of the COVID-19 declaration under Section 564(b)(1) of the Act, 21 U.S.C.section 360bbb-3(b)(1), unless the authorization is terminated  or revoked sooner.       Influenza A by PCR NEGATIVE NEGATIVE Final  Influenza B by PCR NEGATIVE NEGATIVE Final    Comment: (NOTE) The Xpert Xpress SARS-CoV-2/FLU/RSV plus assay is intended as an aid in the diagnosis of influenza from Nasopharyngeal swab specimens and should not be  used as a sole basis for treatment. Nasal washings and aspirates are unacceptable for Xpert Xpress SARS-CoV-2/FLU/RSV testing.  Fact Sheet for Patients: BloggerCourse.com  Fact Sheet for Healthcare Providers: SeriousBroker.it  This test is not yet approved or cleared by the [place] FDA and has been authorized for detection and/or diagnosis of SARS-CoV-2 by FDA under an Emergency Use Authorization (EUA). This EUA will remain in effect (meaning this test can be used) for the duration of the COVID-19 declaration under Section 564(b)(1) of the Act, 21 U.S.C. section 360bbb-3(b)(1), unless the authorization is terminated or revoked.  Performed at [facility], [address] [place], [place] [address]   Blood culture (routine x 2)     Status: None (Preliminary result)   Collection Time: 05/01/20 11:30 AM   Specimen: BLOOD  Result Value Ref Range Status   Specimen Description   Final    BLOOD BLOOD LEFT FOREARM Performed at [facility], [address] [place], [place] [address]    Special Requests   Final    BOTTLES DRAWN AEROBIC AND ANAEROBIC Blood Culture adequate volume Performed at [facility], [address] [place], [place] [address]    Culture   Final    NO GROWTH 4 DAYS Performed at [facility], [address] [place], [place] [address]    Report Status PENDING  Incomplete      Radiology Studies: DG CHEST PORT 1 VIEW  Result Date: 05/03/2020 CLINICAL DATA:  Shortness of breath EXAM: PORTABLE CHEST 1 VIEW COMPARISON:  05/01/2020 FINDINGS: Stable cardiomegaly. Diffuse interstitial prominence with increasing patchy airspace opacity in the right lower lobe. No large pleural fluid collection. No pneumothorax. IMPRESSION: Diffuse interstitial prominence with increasing patchy airspace opacity in the right lower lobe, concerning for worsening infection or edema. Electronically  Signed   By: Duanne Guess D.O.   On: 05/03/2020 14:26   ECHOCARDIOGRAM COMPLETE  Result Date: 05/04/2020    ECHOCARDIOGRAM REPORT   Patient Name:   JAKHI DISHMAN Date of Exam: 05/04/2020 Medical Rec #:  [id number]     Height:       70.0 in Accession #:    [id number]    Weight:       292.0 lb Date of Birth:  11/06/1960     BSA:          2.452 m Patient Age:    59 years      BP:           146/68 mmHg Patient Gender: M             HR:           86 bpm. Exam Location:  Inpatient Procedure: 2D Echo and Intracardiac Opacification Agent Indications:    Acute Respiratory Distress R06.03  History:        Patient has no prior history of Echocardiogram examinations.                 Risk Factors:Hypertension and Diabetes.  Sonographer:    Thurman Coyer RDCS (AE) Referring Phys: [id number] [facility]  Sonographer Comments: Technically difficult study due to poor echo windows. Image acquisition challenging due to patient body habitus. IMPRESSIONS  1. Left ventricular ejection fraction, by  estimation, is 60 to 65%. The left ventricle has normal function. The left ventricle has no regional wall motion abnormalities. There is moderate concentric left ventricular hypertrophy. Left ventricular diastolic parameters are consistent with Grade II diastolic dysfunction (pseudonormalization). Elevated left atrial pressure.  2. Right ventricular systolic function is normal. The right ventricular size is normal. There is normal pulmonary artery systolic pressure.  3. Left atrial size was moderately dilated.  4. The mitral valve is normal in structure. Mild mitral valve regurgitation. No evidence of mitral stenosis.  5. The aortic valve has an indeterminant number of cusps. There is moderate calcification of the aortic valve. There is moderate thickening of the aortic valve. Aortic valve regurgitation is not visualized. Mild aortic valve stenosis. Aortic valve mean gradient measures 10.0 mmHg.  6. The inferior vena cava is dilated  in size with >50% respiratory variability, suggesting right atrial pressure of 8 mmHg. FINDINGS  Left Ventricle: Left ventricular ejection fraction, by estimation, is 60 to 65%. The left ventricle has normal function. The left ventricle has no regional wall motion abnormalities. Definity contrast agent was given IV to delineate the left ventricular  endocardial borders. The left ventricular internal cavity size was normal in size. There is moderate concentric left ventricular hypertrophy. Left ventricular diastolic parameters are consistent with Grade II diastolic dysfunction (pseudonormalization).  Elevated left atrial pressure. Right Ventricle: The right ventricular size is normal. No increase in right ventricular wall thickness. Right ventricular systolic function is normal. There is normal pulmonary artery systolic pressure. Left Atrium: Left atrial size was moderately dilated. Right Atrium: Right atrial size was normal in size. Pericardium: There is no evidence of pericardial effusion. Mitral Valve: The mitral valve is normal in structure. Mild mitral valve regurgitation. No evidence of mitral valve stenosis. Tricuspid Valve: The tricuspid valve is normal in structure. Tricuspid valve regurgitation is mild . No evidence of tricuspid stenosis. Aortic Valve: The aortic valve has an indeterminant number of cusps. There is moderate calcification of the aortic valve. There is moderate thickening of the aortic valve. Aortic valve regurgitation is not visualized. Mild aortic stenosis is present. Aortic valve mean gradient measures 10.0 mmHg. Aortic valve peak gradient measures 15.5 mmHg. Aortic valve area, by VTI measures 3.58 cm. Pulmonic Valve: The pulmonic valve was normal in structure. Pulmonic valve regurgitation is not visualized. No evidence of pulmonic stenosis. Aorta: The aortic root is normal in size and structure. Venous: The inferior vena cava is dilated in size with greater than 50% respiratory  variability, suggesting right atrial pressure of 8 mmHg. IAS/Shunts: No atrial level shunt detected by color flow Doppler.  LEFT VENTRICLE PLAX 2D LVIDd:         5.90 cm  Diastology LVIDs:         3.70 cm  LV e' medial:    5.98 cm/s LV PW:         1.30 cm  LV E/e' medial:  19.1 LV IVS:        1.30 cm  LV e' lateral:   10.10 cm/s LVOT diam:     2.50 cm  LV E/e' lateral: 11.3 LV SV:         136 LV SV Index:   55 LVOT Area:     4.91 cm  RIGHT VENTRICLE RV S prime:     18.40 cm/s TAPSE (M-mode): 2.6 cm LEFT ATRIUM             Index       RIGHT ATRIUM  Index LA diam:        5.20 cm 2.12 cm/m  RA Area:     13.70 cm LA Vol (A2C):   92.7 ml 37.81 ml/m RA Volume:   29.60 ml  12.07 ml/m LA Vol (A4C):   68.8 ml 28.06 ml/m LA Biplane Vol: 81.8 ml 33.37 ml/m  AORTIC VALVE AV Area (Vmax):    3.29 cm AV Area (Vmean):   3.46 cm AV Area (VTI):     3.58 cm AV Vmax:           197.00 cm/s AV Vmean:          152.000 cm/s AV VTI:            0.380 m AV Peak Grad:      15.5 mmHg AV Mean Grad:      10.0 mmHg LVOT Vmax:         132.00 cm/s LVOT Vmean:        107.000 cm/s LVOT VTI:          0.277 m LVOT/AV VTI ratio: 0.73  AORTA Ao Root diam: 3.80 cm MITRAL VALVE MV Area (PHT): 3.99 cm     SHUNTS MV Decel Time: 190 msec     Systemic VTI:  0.28 m MV E velocity: 114.00 cm/s  Systemic Diam: 2.50 cm MV A velocity: 107.00 cm/s MV E/A ratio:  1.07 Tobias AlexanderKatarina Nelson MD Electronically signed by Tobias AlexanderKatarina Nelson MD Signature Date/Time: 05/04/2020/3:38:06 PM    Final       Scheduled Meds: . amLODipine  10 mg Oral Daily  . atorvastatin  80 mg Oral Daily  . folic acid  1 mg Oral Daily  . heparin injection (subcutaneous)  5,000 Units Subcutaneous Q8H  . insulin aspart  0-20 Units Subcutaneous Q4H  . insulin aspart  15 Units Subcutaneous TID WC  . insulin glargine  70 Units Subcutaneous Daily  . ipratropium-albuterol  3 mL Nebulization Q6H  . methylPREDNISolone (SOLU-MEDROL) injection  60 mg Intravenous Q12H  .  mometasone-formoterol  2 puff Inhalation BID  . multivitamin with minerals  1 tablet Oral Daily  . sodium chloride flush  3 mL Intravenous Q12H  . thiamine  100 mg Oral Daily   Or  . thiamine  100 mg Intravenous Daily   Continuous Infusions: . sodium chloride    . azithromycin 500 mg (05/04/20 1801)  . cefTRIAXone (ROCEPHIN)  IV 1 g (05/04/20 1631)     LOS: 3 days      Time spent: 25 minutes   Noralee StainJennifer Solei Wubben, DO [facility] 05/05/2020, 9:27 AM   Available via Epic secure chat 7am-7pm After these hours, please refer to coverage provider listed on [email or web address]

## 2020-05-06 ENCOUNTER — Inpatient Hospital Stay (HOSPITAL_COMMUNITY): Payer: BC Managed Care – PPO

## 2020-05-06 DIAGNOSIS — J9601 Acute respiratory failure with hypoxia: Secondary | ICD-10-CM | POA: Diagnosis not present

## 2020-05-06 LAB — CULTURE, BLOOD (ROUTINE X 2)
Culture: NO GROWTH
Special Requests: ADEQUATE

## 2020-05-06 LAB — BASIC METABOLIC PANEL
Anion gap: 10 (ref 5–15)
BUN: 95 mg/dL — ABNORMAL HIGH (ref 6–20)
CO2: 23 mmol/L (ref 22–32)
Calcium: 8.4 mg/dL — ABNORMAL LOW (ref 8.9–10.3)
Chloride: 104 mmol/L (ref 98–111)
Creatinine, Ser: 2.89 mg/dL — ABNORMAL HIGH (ref 0.61–1.24)
GFR, Estimated: 24 mL/min — ABNORMAL LOW (ref >60)
Glucose, Bld: 113 mg/dL — ABNORMAL HIGH (ref 70–99)
Potassium: 5.2 mmol/L — ABNORMAL HIGH (ref 3.5–5.1)
Sodium: 137 mmol/L (ref 135–145)

## 2020-05-06 LAB — GLUCOSE, CAPILLARY
Glucose-Capillary: 121 mg/dL — ABNORMAL HIGH (ref 70–99)
Glucose-Capillary: 99 mg/dL (ref 70–99)
Glucose-Capillary: 319 mg/dL — ABNORMAL HIGH (ref 70–99)
Glucose-Capillary: 184 mg/dL — ABNORMAL HIGH (ref 70–99)
Glucose-Capillary: 90 mg/dL (ref 70–99)

## 2020-05-06 MED ORDER — INSULIN GLARGINE 100 UNIT/ML ~~LOC~~ SOLN
60.0000 [IU] | Freq: Every day | SUBCUTANEOUS | Status: DC
  Administered 2020-05-06 – 2020-05-07 (×2): 60 [IU] via SUBCUTANEOUS
  Filled 2020-05-06 (×2): qty 0.6

## 2020-05-06 MED ORDER — IPRATROPIUM-ALBUTEROL 0.5-2.5 (3) MG/3ML IN SOLN
3.0000 mL | Freq: Three times a day (TID) | RESPIRATORY_TRACT | Status: DC
  Administered 2020-05-06 – 2020-05-07 (×2): 3 mL via RESPIRATORY_TRACT
  Filled 2020-05-06 (×2): qty 3

## 2020-05-06 MED ORDER — SODIUM ZIRCONIUM CYCLOSILICATE 10 G PO PACK
10.0000 g | PACK | Freq: Two times a day (BID) | ORAL | Status: DC
  Administered 2020-05-06 – 2020-05-07 (×2): 10 g via ORAL
  Filled 2020-05-06 (×3): qty 1

## 2020-05-06 NOTE — Progress Notes (Signed)
Pt needs to be set up for a sleep study

## 2020-05-06 NOTE — Progress Notes (Addendum)
PROGRESS NOTE    Darald Uzzle  UXL:[id number] DOB: 12-12-1960 DOA: 05/01/2020 PCP: Noralee Stain, DO     Brief Narrative:  Jack Gonzalez is a 59 y.o. male with medical history significant of DM, HTN, HLD who presents to the hospital with chief complaint of SOB, chest tightness, wheezing, cough, malaise, hypoxia. She was recently treated for pneumonia several weeks ago. On arrival to the ED, her oxygen saturation was found to be 76% on room air. He had also had COVID+ over 3 months ago.  On examination, he denies any fevers or chest pain or abdominal pain or nausea, vomiting or diarrhea.  He does admit to dry cough.  His 49-month-old granddaughter had tested positive for RSV recently.  He also admits to 2 x 40 ounce beer consumption on a daily basis, last use 12/22.   New events last 24 hours / Subjective: Patient examined with iPad interpreter at bedside.  Patient states that his breathing is better.  Urinating well.  Denies any complaints.  Assessment & Plan:   Active Problems:   Viral pneumonia   Acute hypoxemic respiratory failure (HCC)    Acute hypoxemic respiratory failure secondary to RSV, with RLL pneumonia -SpO2 76% on room air, required 2 L on admission -Influenza and COVID negative  -Per EDP, patient tested positive for RSV (I'm unable to view results in epic) -Chest x-ray obtained 12/25 which revealed diffuse interstitial prominence, increasing patchy airspace opacity in the right lower lobe.  He was started on Rocephin, azithromycin. -Continue IV steroid, breathing tx, dulera, duonebs   -Weaned to room air this morning -Repeat chest x-ray pending  Acute diastolic HF  -Echocardiogram showed EF 60-65%, grade 2 diastolic dysfunction  -Continue IV Lasix and monitor strict I's and O's, daily weight  Hypertensive urgency -BP 215/101 in the ED -Elevated BP, holding chlorthalidone and lisinopril due to AKI. -Continue Norvasc, Lasix  AKI -Baseline creatinine 1.23   -Renal US unremarkable -FeNa 0.67% indicating prerenal etiology  -Hold chlorthalidone and lisinopril, last dose 12/23, given IV lasix 12/25  -Appreciate nephrology, started on IV Lasix 12/27 -Improving, continue to trend BMP  Mild hyperkalemia -IV Lasix  HLD -Continue Lipitor  DM -Hold invokamet, ozempic -Ha1c 7.5  -Lantus, SSI   Demand ischemia -Trop 22 --> 19   Alcohol abuse -Last drink 12/22  Blood culture contaminant -One bottle of blood culture from 12/23 with gram-positive cocci in clusters, gram-positive rod.  Other 3 bottles are negative to date.  BC ID with no organism detected.  Repeat blood cultures drawn 12/27    DVT prophylaxis: Lovenox   Code Status: Full code Family Communication: Wife at bedside, also updated daughter over the phone during examination on speaker phone Disposition Plan:  Status is: Inpatient  Remains inpatient appropriate because:IV treatments appropriate due to intensity of illness or inability to take PO   Dispo: The patient is from: Home              Anticipated d/c is to: Home              Anticipated d/c date is: 2 days              Patient currently is not medically stable to d/c.  Bilateral wheezes have improved, continue IV Lasix.  Creatinine improving today.   Consultants:   Nephrology   Procedures:   None  Antimicrobials:  Anti-infectives (From admission, onward)   Start     Dose/Rate Route Frequency Ordered Stop   05/03/20 1600  cefTRIAXone (ROCEPHIN) 1 g in sodium chloride 0.9 % 100 mL IVPB        1 g 200 mL/hr over 30 Minutes Intravenous Every 24 hours 05/03/20 1444 05/08/20 1559   05/03/20 1600  azithromycin (ZITHROMAX) 500 mg in sodium chloride 0.9 % 250 mL IVPB        500 mg 250 mL/hr over 60 Minutes Intravenous Every 24 hours 05/03/20 1453 05/08/20 1559   05/03/20 1530  azithromycin (ZITHROMAX) 250 mg in dextrose 5 % 125 mL IVPB  Status:  Discontinued        250 mg 125 mL/hr over 60 Minutes  Intravenous Every 24 hours 05/03/20 1444 05/03/20 1453       Objective: Vitals:   05/06/20 0210 05/06/20 0606 05/06/20 0642 05/06/20 0651  BP:  (!) 168/81 (!) 173/85   Pulse:  75 80   Resp:   18   Temp:   98.4 F (36.9 C)   TempSrc:   Oral   SpO2: 100% 98% 95% 95%  Weight:      Height:        Intake/Output Summary (Last 24 hours) at 05/06/2020 0950 Last data filed at 05/06/2020 0910 Gross per 24 hour  Intake 2716.14 ml  Output 2100 ml  Net 616.14 ml   Filed Weights   05/01/20 0948 05/05/20 1701  Weight: 132.5 kg (!) 138.9 kg    Examination: General exam: Appears calm and comfortable  Respiratory system: Expiratory wheezes, but much improved from examination yesterday.  No respiratory distress Cardiovascular system: S1 & S2 heard, RRR. + Pitting pedal edema. Gastrointestinal system: Abdomen is mildly distended, soft and nontender. Normal bowel sounds heard. Central nervous system: Alert and oriented. Non focal exam. Speech clear  Extremities: Symmetric in appearance bilaterally  Skin: No rashes, lesions or ulcers on exposed skin  Psychiatry: Judgement and insight appear stable. Mood & affect appropriate.    Data Reviewed: I have personally reviewed following labs and imaging studies  CBC: Recent Labs  Lab 05/01/20 1000 05/02/20 0541 05/03/20 0410 05/05/20 0418  WBC 5.1 5.3 5.0 6.6  NEUTROABS 3.5  --   --   --   HGB 13.8 12.7* 12.9* 11.7*  HCT 41.5 39.4 40.8 36.4*  MCV 91.6 96.8 96.0 95.0  PLT 178 176 196 170   Basic Metabolic Panel: Recent Labs  Lab 05/02/20 0758 05/03/20 0410 05/03/20 1157 05/03/20 1901 05/03/20 2316 05/04/20 0426 05/05/20 0418 05/06/20 0553  NA 134* 131*  --   --   --  132* 133* 137  K 5.2* 5.4*   < > 5.0 5.2* 5.1 5.2* 5.2*  CL 101 99  --   --   --  102 102 104  CO2 25 22  --   --   --  22 23 23   GLUCOSE 193* 449*  --   --   --  224* 155* 113*  BUN 55* 64*  --   --   --  76* 91* 95*  CREATININE 2.02* 2.35*  --   --   --   3.37* 3.80* 2.89*  CALCIUM 8.5* 8.1*  --   --   --  7.9* 8.2* 8.4*  MG  --  2.7*  --   --   --   --   --   --    < > = values in this interval not displayed.   GFR: Estimated Creatinine Clearance: 39.2 mL/min (A) (by C-G formula based on SCr of 2.89 mg/dL (H)). Liver  Function Tests: Recent Labs  Lab 05/01/20 1000  AST 25  ALT 20  ALKPHOS 114  BILITOT 0.4  PROT 6.7  ALBUMIN 2.8*   No results for input(s): LIPASE, AMYLASE in the last 168 hours. No results for input(s): AMMONIA in the last 168 hours. Coagulation Profile: No results for input(s): INR, PROTIME in the last 168 hours. Cardiac Enzymes: No results for input(s): CKTOTAL, CKMB, CKMBINDEX, TROPONINI in the last 168 hours. BNP (last 3 results) No results for input(s): PROBNP in the last 8760 hours. HbA1C: No results for input(s): HGBA1C in the last 72 hours. CBG: Recent Labs  Lab 05/05/20 1644 05/05/20 2016 05/05/20 2344 05/06/20 0413 05/06/20 0737  GLUCAP 283* 262* 195* 121* 99   Lipid Profile: No results for input(s): CHOL, HDL, LDLCALC, TRIG, CHOLHDL, LDLDIRECT in the last 72 hours. Thyroid Function Tests: No results for input(s): TSH, T4TOTAL, FREET4, T3FREE, THYROIDAB in the last 72 hours. Anemia Panel: No results for input(s): VITAMINB12, FOLATE, FERRITIN, TIBC, IRON, RETICCTPCT in the last 72 hours. Sepsis Labs: Recent Labs  Lab 05/01/20 1000 05/01/20 1213  LATICACIDVEN 1.0 0.7    Recent Results (from the past 240 hour(s))  Blood culture (routine x 2)     Status: None (Preliminary result)   Collection Time: 05/01/20 10:00 AM   Specimen: BLOOD  Result Value Ref Range Status   Specimen Description   Final    BLOOD LEFT ANTECUBITAL Performed at [facility], [address] [place], [place] [address]    Special Requests   Final    BOTTLES DRAWN AEROBIC AND ANAEROBIC Blood Culture adequate volume Performed at [facility], [address] [place], [place] [address]     Culture  Setup Time   Final    GRAM POSITIVE COCCI IN CLUSTERS GRAM POSITIVE RODS ANAEROBIC BOTTLE ONLY Organism ID to follow CRITICAL RESULT CALLED TO, READ BACK BY AND VERIFIED WITH: Talmadge Chad RN [id number] 05/05/20 A BROWNING    Culture   Final    NO GROWTH CULTURE REINCUBATED FOR BETTER GROWTH Performed at [facility], [address] [place], [place] [address]    Report Status PENDING  Incomplete  Blood Culture ID Panel (Reflexed)     Status: None   Collection Time: 05/01/20 10:00 AM  Result Value Ref Range Status   Enterococcus faecalis NOT DETECTED NOT DETECTED Final   Enterococcus Faecium NOT DETECTED NOT DETECTED Final   Listeria monocytogenes NOT DETECTED NOT DETECTED Final   Staphylococcus species NOT DETECTED NOT DETECTED Final   Staphylococcus aureus (BCID) NOT DETECTED NOT DETECTED Final   Staphylococcus epidermidis NOT DETECTED NOT DETECTED Final   Staphylococcus lugdunensis NOT DETECTED NOT DETECTED Final   Streptococcus species NOT DETECTED NOT DETECTED Final   Streptococcus agalactiae NOT DETECTED NOT DETECTED Final   Streptococcus pneumoniae NOT DETECTED NOT DETECTED Final   Streptococcus pyogenes NOT DETECTED NOT DETECTED Final   A.calcoaceticus-baumannii NOT DETECTED NOT DETECTED Final   Bacteroides fragilis NOT DETECTED NOT DETECTED Final   Enterobacterales NOT DETECTED NOT DETECTED Final   Enterobacter cloacae complex NOT DETECTED NOT DETECTED Final   Escherichia coli NOT DETECTED NOT DETECTED Final   Klebsiella aerogenes NOT DETECTED NOT DETECTED Final   Klebsiella oxytoca NOT DETECTED NOT DETECTED Final   Klebsiella pneumoniae NOT DETECTED NOT DETECTED Final   Proteus species NOT DETECTED NOT DETECTED Final   Salmonella species NOT DETECTED NOT DETECTED Final   Serratia marcescens NOT DETECTED NOT DETECTED Final  Haemophilus influenzae NOT DETECTED NOT DETECTED Final   Neisseria meningitidis NOT DETECTED NOT DETECTED Final   Pseudomonas aeruginosa  NOT DETECTED NOT DETECTED Final   Stenotrophomonas maltophilia NOT DETECTED NOT DETECTED Final   Candida albicans NOT DETECTED NOT DETECTED Final   Candida auris NOT DETECTED NOT DETECTED Final   Candida glabrata NOT DETECTED NOT DETECTED Final   Candida krusei NOT DETECTED NOT DETECTED Final   Candida parapsilosis NOT DETECTED NOT DETECTED Final   Candida tropicalis NOT DETECTED NOT DETECTED Final   Cryptococcus neoformans/gattii NOT DETECTED NOT DETECTED Final    Comment: Performed at [facility], [address] [place], [place] [address]  Resp Panel by RT-PCR (Flu A&B, Covid) Nasopharyngeal Swab     Status: None   Collection Time: 05/01/20 10:20 AM   Specimen: Nasopharyngeal Swab; Nasopharyngeal(NP) swabs in vial transport medium  Result Value Ref Range Status   SARS Coronavirus 2 by RT PCR NEGATIVE NEGATIVE Final    Comment: (NOTE) SARS-CoV-2 target nucleic acids are NOT DETECTED.  The SARS-CoV-2 RNA is generally detectable in upper respiratory specimens during the acute phase of infection. The lowest concentration of SARS-CoV-2 viral copies this assay can detect is 138 copies/mL. A negative result does not preclude SARS-Cov-2 infection and should not be used as the sole basis for treatment or other patient management decisions. A negative result may occur with  improper specimen collection/handling, submission of specimen other than nasopharyngeal swab, presence of viral mutation(s) within the areas targeted by this assay, and inadequate number of viral copies(<138 copies/mL). A negative result must be combined with clinical observations, patient history, and epidemiological information. The expected result is Negative.  Fact Sheet for Patients:  BloggerCourse.com  Fact Sheet for Healthcare Providers:  SeriousBroker.it  This test is no t yet approved or cleared by the [place] FDA and  has been authorized for  detection and/or diagnosis of SARS-CoV-2 by FDA under an Emergency Use Authorization (EUA). This EUA will remain  in effect (meaning this test can be used) for the duration of the COVID-19 declaration under Section 564(b)(1) of the Act, 21 U.S.C.section 360bbb-3(b)(1), unless the authorization is terminated  or revoked sooner.       Influenza A by PCR NEGATIVE NEGATIVE Final   Influenza B by PCR NEGATIVE NEGATIVE Final    Comment: (NOTE) The Xpert Xpress SARS-CoV-2/FLU/RSV plus assay is intended as an aid in the diagnosis of influenza from Nasopharyngeal swab specimens and should not be used as a sole basis for treatment. Nasal washings and aspirates are unacceptable for Xpert Xpress SARS-CoV-2/FLU/RSV testing.  Fact Sheet for Patients: BloggerCourse.com  Fact Sheet for Healthcare Providers: SeriousBroker.it  This test is not yet approved or cleared by the [place] FDA and has been authorized for detection and/or diagnosis of SARS-CoV-2 by FDA under an Emergency Use Authorization (EUA). This EUA will remain in effect (meaning this test can be used) for the duration of the COVID-19 declaration under Section 564(b)(1) of the Act, 21 U.S.C. section 360bbb-3(b)(1), unless the authorization is terminated or revoked.  Performed at [facility], [address] [place], [place] [address]   Blood culture (routine x 2)     Status: None (Preliminary result)   Collection Time: 05/01/20 11:30 AM   Specimen: BLOOD  Result Value Ref Range Status   Specimen Description   Final    BLOOD BLOOD LEFT FOREARM Performed at [facility], [address] [place], [place] [address]  Special Requests   Final    BOTTLES DRAWN AEROBIC AND ANAEROBIC Blood Culture adequate volume Performed at [facility], [address] [place], [place] [address]    Culture   Final    NO GROWTH 4 DAYS Performed at [facility], [address], [place], [place] [address]    Report Status PENDING  Incomplete      Radiology Studies: ECHOCARDIOGRAM COMPLETE  Result Date: 05/04/2020    ECHOCARDIOGRAM REPORT   Patient Name:   KAYMAN SNUFFER Date of Exam: 05/04/2020 Medical Rec #:  [id number]     Height:       70.0 in Accession #:    [id number]    Weight:       292.0 lb Date of Birth:  July 30, 1960     BSA:          2.452 m Patient Age:    59 years      BP:           146/68 mmHg Patient Gender: M             HR:           86 bpm. Exam Location:  Inpatient Procedure: 2D Echo and Intracardiac Opacification Agent Indications:    Acute Respiratory Distress R06.03  History:        Patient has no prior history of Echocardiogram examinations.                 Risk Factors:Hypertension and Diabetes.  Sonographer:    Thurman Coyer RDCS (AE) Referring Phys: [id number] [facility]  Sonographer Comments: Technically difficult study due to poor echo windows. Image acquisition challenging due to patient body habitus. IMPRESSIONS  1. Left ventricular ejection fraction, by estimation, is 60 to 65%. The left ventricle has normal function. The left ventricle has no regional wall motion abnormalities. There is moderate concentric left ventricular hypertrophy. Left ventricular diastolic parameters are consistent with Grade II diastolic dysfunction (pseudonormalization). Elevated left atrial pressure.  2. Right ventricular systolic function is normal. The right ventricular size is normal. There is normal pulmonary artery systolic pressure.  3. Left atrial size was moderately dilated.  4. The mitral valve is normal in structure. Mild mitral valve regurgitation. No evidence of mitral stenosis.  5. The aortic valve has an indeterminant number of cusps. There is moderate calcification of the aortic valve. There is moderate thickening of the aortic valve. Aortic valve regurgitation is not visualized. Mild aortic valve stenosis. Aortic valve mean  gradient measures 10.0 mmHg.  6. The inferior vena cava is dilated in size with >50% respiratory variability, suggesting right atrial pressure of 8 mmHg. FINDINGS  Left Ventricle: Left ventricular ejection fraction, by estimation, is 60 to 65%. The left ventricle has normal function. The left ventricle has no regional wall motion abnormalities. Definity contrast agent was given IV to delineate the left ventricular  endocardial borders. The left ventricular internal cavity size was normal in size. There is moderate concentric left ventricular hypertrophy. Left ventricular diastolic parameters are consistent with Grade II diastolic dysfunction (pseudonormalization).  Elevated left atrial pressure. Right Ventricle: The right ventricular size is normal. No increase in right ventricular wall thickness. Right ventricular systolic function is normal. There is normal pulmonary artery systolic pressure. Left Atrium: Left atrial size was moderately dilated. Right Atrium: Right atrial size was normal in size. Pericardium: There is no evidence of pericardial effusion. Mitral Valve: The mitral valve is normal in structure. Mild  mitral valve regurgitation. No evidence of mitral valve stenosis. Tricuspid Valve: The tricuspid valve is normal in structure. Tricuspid valve regurgitation is mild . No evidence of tricuspid stenosis. Aortic Valve: The aortic valve has an indeterminant number of cusps. There is moderate calcification of the aortic valve. There is moderate thickening of the aortic valve. Aortic valve regurgitation is not visualized. Mild aortic stenosis is present. Aortic valve mean gradient measures 10.0 mmHg. Aortic valve peak gradient measures 15.5 mmHg. Aortic valve area, by VTI measures 3.58 cm. Pulmonic Valve: The pulmonic valve was normal in structure. Pulmonic valve regurgitation is not visualized. No evidence of pulmonic stenosis. Aorta: The aortic root is normal in size and structure. Venous: The inferior vena  cava is dilated in size with greater than 50% respiratory variability, suggesting right atrial pressure of 8 mmHg. IAS/Shunts: No atrial level shunt detected by color flow Doppler.  LEFT VENTRICLE PLAX 2D LVIDd:         5.90 cm  Diastology LVIDs:         3.70 cm  LV e' medial:    5.98 cm/s LV PW:         1.30 cm  LV E/e' medial:  19.1 LV IVS:        1.30 cm  LV e' lateral:   10.10 cm/s LVOT diam:     2.50 cm  LV E/e' lateral: 11.3 LV SV:         136 LV SV Index:   55 LVOT Area:     4.91 cm  RIGHT VENTRICLE RV S prime:     18.40 cm/s TAPSE (M-mode): 2.6 cm LEFT ATRIUM             Index       RIGHT ATRIUM           Index LA diam:        5.20 cm 2.12 cm/m  RA Area:     13.70 cm LA Vol (A2C):   92.7 ml 37.81 ml/m RA Volume:   29.60 ml  12.07 ml/m LA Vol (A4C):   68.8 ml 28.06 ml/m LA Biplane Vol: 81.8 ml 33.37 ml/m  AORTIC VALVE AV Area (Vmax):    3.29 cm AV Area (Vmean):   3.46 cm AV Area (VTI):     3.58 cm AV Vmax:           197.00 cm/s AV Vmean:          152.000 cm/s AV VTI:            0.380 m AV Peak Grad:      15.5 mmHg AV Mean Grad:      10.0 mmHg LVOT Vmax:         132.00 cm/s LVOT Vmean:        107.000 cm/s LVOT VTI:          0.277 m LVOT/AV VTI ratio: 0.73  AORTA Ao Root diam: 3.80 cm MITRAL VALVE MV Area (PHT): 3.99 cm     SHUNTS MV Decel Time: 190 msec     Systemic VTI:  0.28 m MV E velocity: 114.00 cm/s  Systemic Diam: 2.50 cm MV A velocity: 107.00 cm/s MV E/A ratio:  1.07 Tobias Alexander MD Electronically signed by Tobias Alexander MD Signature Date/Time: 05/04/2020/3:38:06 PM    Final       Scheduled Meds: . amLODipine  10 mg Oral Daily  . atorvastatin  80 mg Oral Daily  . folic acid  1 mg  Oral Daily  . furosemide  80 mg Intravenous Q12H  . heparin injection (subcutaneous)  5,000 Units Subcutaneous Q8H  . insulin aspart  0-20 Units Subcutaneous Q4H  . insulin aspart  15 Units Subcutaneous TID WC  . insulin glargine  70 Units Subcutaneous Daily  . ipratropium-albuterol  3 mL  Nebulization Q6H  . methylPREDNISolone (SOLU-MEDROL) injection  60 mg Intravenous Q12H  . mometasone-formoterol  2 puff Inhalation BID  . multivitamin with minerals  1 tablet Oral Daily  . sodium chloride flush  3 mL Intravenous Q12H  . thiamine  100 mg Oral Daily   Or  . thiamine  100 mg Intravenous Daily   Continuous Infusions: . sodium chloride    . azithromycin 500 mg (05/05/20 1522)  . cefTRIAXone (ROCEPHIN)  IV 1 g (05/05/20 1804)     LOS: 4 days      Time spent: 25 minutes   Noralee Stain, DO [facility] 05/06/2020, 9:50 AM   Available via Epic secure chat 7am-7pm After these hours, please refer to coverage provider listed on [email or web address]

## 2020-05-06 NOTE — Progress Notes (Signed)
[facility] Progress Note  Subjective: 1540 cc UOP after MN today so far. Creat down 2.89, K 5.2. BP's a bit high.   Vitals:   05/06/20 0210 05/06/20 0606 05/06/20 0642 05/06/20 0651  BP:  (!) 168/81 (!) 173/85   Pulse:  75 80   Resp:   18   Temp:   98.4 F (36.9 C)   TempSrc:   Oral   SpO2: 100% 98% 95% 95%  Weight:      Height:        Exam: Gen obese hispanic male in the chair, no distress, looks better No jvd or bruits Chest scattered post rales and bilat exp wheezing, decreased from yest RRR no MRG Abd soft ntnd no mass or ascites +bs +abd wall edema Ext 2+ bilat UE/ LE / hip edema Neuro is alert, Ox 3 , nf, no asterixis    Home meds:  - invokamet 150-500 qd/ metformin 1 gm bid/ lantus 60u qd/ semaglutide   - lisinopril 40 qd/ chlorthalidone 25 qd  - lipitor 80 qd  - symbicort qd  - prn's/ vitamins/ supplements    UNa 30, UCr 68      Renal US 12/24 - 12.6/ 14 cm kidneys, no hydro, no ^echo     UA pend     CXR 12/ 25 - RLL early infiltrate     Alb 2.8   AST 25, ALT 20, tbili 0.4         CT angio w/ contrast 12/23 - IMPRESSION: 1. No pulmonary emboli. 2. Bilateral hazy and patchy pulmonary infiltrates, most pronounced in the right upper lobe. Findings are nonspecific and could be due to viral or bacterial pneumonia. No dense consolidation or lobar collapse.       I/O 8.5 L in and 1.6 L out = net + 6.9 L       Weights  132kg admit , peak,  yest , today       Date             Creat               eGFR     12/23           1.23                 > 60     12/24           2.11                 36     12 /25          2.35                 31      12/ 26         3.37                 20      12/ 27         3.80                 18 ml/min    CXR 12/25 - no edema, RLL infiltrate  Assessment/ Plan: 1. AKI - severe AKI due to contrast / acei/ dehydration on admit.  Creat 1.2 on admit, peak 3.8 on 12/27 > 2.9 today. Acei dc'd after 1st hosp day. Started IV  lasix yest for vol overload. Doing better today, breathing better, and diuresing w/ drop in creat. Cont supportive  care, avoid contrast/ acei/ ARB/ nsaids. Still very swollen, will cont IV lasix 80 bid.   2. Vol overload - cont IV lasix  3. HTN - home meds on hold  (lisinopril, hctz), on norvasc w/ good BP 4. CAP - on IV abx per primary team, improving overall 5. Obesity 6. IDDM - per primary team 7. Hyperkalemia - renal diet, lokelma ordered      Rob Corban Kistler 05/06/2020, 10:02 AM   Recent Labs  Lab 05/03/20 0410 05/03/20 1157 05/05/20 0418 05/06/20 0553  K 5.4*   < > 5.2* 5.2*  BUN 64*   < > 91* 95*  CREATININE 2.35*   < > 3.80* 2.89*  CALCIUM 8.1*   < > 8.2* 8.4*  HGB 12.9*  --  11.7*  --    < > = values in this interval not displayed.   Inpatient medications: . amLODipine  10 mg Oral Daily  . atorvastatin  80 mg Oral Daily  . folic acid  1 mg Oral Daily  . furosemide  80 mg Intravenous Q12H  . heparin injection (subcutaneous)  5,000 Units Subcutaneous Q8H  . insulin aspart  0-20 Units Subcutaneous Q4H  . insulin aspart  15 Units Subcutaneous TID WC  . insulin glargine  70 Units Subcutaneous Daily  . ipratropium-albuterol  3 mL Nebulization Q6H  . methylPREDNISolone (SOLU-MEDROL) injection  60 mg Intravenous Q12H  . mometasone-formoterol  2 puff Inhalation BID  . multivitamin with minerals  1 tablet Oral Daily  . sodium chloride flush  3 mL Intravenous Q12H  . thiamine  100 mg Oral Daily   Or  . thiamine  100 mg Intravenous Daily   . sodium chloride    . azithromycin 500 mg (05/05/20 1522)  . cefTRIAXone (ROCEPHIN)  IV 1 g (05/05/20 1804)   sodium chloride, acetaminophen **OR** acetaminophen, hydrALAZINE, ipratropium-albuterol, ondansetron **OR** ondansetron (ZOFRAN) IV, polyethylene glycol, sodium chloride flush

## 2020-05-07 DIAGNOSIS — B974 Respiratory syncytial virus as the cause of diseases classified elsewhere: Secondary | ICD-10-CM | POA: Diagnosis not present

## 2020-05-07 DIAGNOSIS — N179 Acute kidney failure, unspecified: Secondary | ICD-10-CM | POA: Diagnosis not present

## 2020-05-07 DIAGNOSIS — J9601 Acute respiratory failure with hypoxia: Secondary | ICD-10-CM | POA: Diagnosis not present

## 2020-05-07 LAB — GLUCOSE, CAPILLARY
Glucose-Capillary: 111 mg/dL — ABNORMAL HIGH (ref 70–99)
Glucose-Capillary: 116 mg/dL — ABNORMAL HIGH (ref 70–99)
Glucose-Capillary: 154 mg/dL — ABNORMAL HIGH (ref 70–99)
Glucose-Capillary: 84 mg/dL (ref 70–99)

## 2020-05-07 LAB — BASIC METABOLIC PANEL
Anion gap: 9 (ref 5–15)
BUN: 87 mg/dL — ABNORMAL HIGH (ref 6–20)
CO2: 27 mmol/L (ref 22–32)
Calcium: 8.6 mg/dL — ABNORMAL LOW (ref 8.9–10.3)
Chloride: 103 mmol/L (ref 98–111)
Creatinine, Ser: 2.09 mg/dL — ABNORMAL HIGH (ref 0.61–1.24)
GFR, Estimated: 36 mL/min — ABNORMAL LOW (ref >60)
Glucose, Bld: 120 mg/dL — ABNORMAL HIGH (ref 70–99)
Potassium: 5 mmol/L (ref 3.5–5.1)
Sodium: 139 mmol/L (ref 135–145)

## 2020-05-07 MED ORDER — FUROSEMIDE 40 MG PO TABS: 80.0000 mg | ORAL_TABLET | Freq: Every morning | ORAL | Status: DC

## 2020-05-07 MED ORDER — PREDNISONE 10 MG PO TABS: ORAL_TABLET | ORAL | 0 refills | Status: AC

## 2020-05-07 MED ORDER — FUROSEMIDE 80 MG PO TABS: 80.0000 mg | ORAL_TABLET | Freq: Every morning | ORAL | 0 refills | Status: AC

## 2020-05-07 MED ORDER — AMLODIPINE BESYLATE 10 MG PO TABS
10.0000 mg | ORAL_TABLET | Freq: Every day | ORAL | 2 refills | Status: AC
Start: 2020-05-08 — End: ?

## 2020-05-07 NOTE — Progress Notes (Addendum)
[facility] Progress Note  Subjective: 1700 cc UOP yest, 1.8 L in .  Labs pending today  Vitals:   05/06/20 1930 05/06/20 2054 05/07/20 0503 05/07/20 0600  BP:  (!) 165/90 (!) 184/94 (!) 177/86  Pulse:  79 76 84  Resp:  16 17   Temp:  98.5 F (36.9 C) 98.7 F (37.1 C)   TempSrc:  Oral Oral   SpO2: 98% 98% 99%   Weight:    (!) 138.7 kg  Height:        Exam: Gen obese hispanic male in the chair, no distress, looks better No jvd or bruits Chest scattered post rales and bilat exp wheezing, decreased from yest RRR no MRG Abd soft ntnd no mass or ascites +bs +abd wall edema Ext 2+ bilat UE/ LE / hip edema Neuro is alert, Ox 3 , nf, no asterixis    Home meds:  - invokamet 150-500 qd/ metformin 1 gm bid/ lantus 60u qd/ semaglutide   - lisinopril 40 qd/ chlorthalidone 25 qd  - lipitor 80 qd  - symbicort qd  - prn's/ vitamins/ supplements    UNa 30, UCr 68      Renal US 12/24 - 12.6/ 14 cm kidneys, no hydro, no ^echo     UA pend     CXR 12/ 25 - RLL early infiltrate     Alb 2.8   AST 25, ALT 20, tbili 0.4         CT angio w/ contrast 12/23 - IMPRESSION: 1. No pulmonary emboli. 2. Bilateral hazy and patchy pulmonary infiltrates, most pronounced in the right upper lobe. Findings are nonspecific and could be due to viral or bacterial pneumonia. No dense consolidation or lobar collapse.   ECHO - LVEF 60%, G2DD       I/O 8.5 L in and 1.6 L out = net + 6.9 L       Weights  132kg admit , peak,  yest , today       Date             Creat               eGFR     12/23           1.23                 > 60     12/24           2.11                 36     12 /25          2.35                 31      12/ 26         3.37                 20      12/ 27         3.80                 18 ml/min    CXR 12/25 - no edema, RLL infiltrate  Assessment/ Plan: 1. AKI - severe AKI due to contrast / acei/ dehydration on admit.  Creat 1.2 on admit > peaked 3.8 on 12/27 > down to 2.9  yest and 2.0 today. Acei dc'd after 1st hosp day. Started IV lasix 12/27 for vol overload.  Still vol overloaded but lungs are much better and w/ dropping creat is okay for dc today.  Will dc him on po lasix 80 qam for next 5 days then stop, will arrange 1 time f/u in our office at [place] in 2 wks.   2. Vol overload - as above, improving 3. HTN - home meds on hold  (lisinopril, hctz), new norvasc w/ BP's still on the high side.  We should avoid ARB/ acei in this patient for 1-2 mos due to AKI.   4. CAP - on IV abx per primary  5. Obesity 6. IDDM - per primary team 7. Hyperkalemia - better    [name] 05/07/2020, 6:57 AM   Recent Labs  Lab 05/03/20 0410 05/03/20 1157 05/05/20 0418 05/06/20 0553  K 5.4*   < > 5.2* 5.2*  BUN 64*   < > 91* 95*  CREATININE 2.35*   < > 3.80* 2.89*  CALCIUM 8.1*   < > 8.2* 8.4*  HGB 12.9*  --  11.7*  --    < > = values in this interval not displayed.   Inpatient medications:  amLODipine  10 mg Oral Daily   atorvastatin  80 mg Oral Daily   folic acid  1 mg Oral Daily   furosemide  80 mg Intravenous Q12H   heparin injection (subcutaneous)  5,000 Units Subcutaneous Q8H   insulin aspart  0-20 Units Subcutaneous Q4H   insulin aspart  15 Units Subcutaneous TID WC   insulin glargine  60 Units Subcutaneous Daily   ipratropium-albuterol  3 mL Nebulization TID   methylPREDNISolone (SOLU-MEDROL) injection  60 mg Intravenous Q12H   mometasone-formoterol  2 puff Inhalation BID   multivitamin with minerals  1 tablet Oral Daily   sodium chloride flush  3 mL Intravenous Q12H   sodium zirconium cyclosilicate  10 g Oral BID   thiamine  100 mg Oral Daily   Or   thiamine  100 mg Intravenous Daily    sodium chloride     azithromycin 250 mL/hr at 05/06/20 1647   cefTRIAXone (ROCEPHIN)  IV 1 g (05/06/20 1537)   sodium chloride, acetaminophen **OR** acetaminophen, hydrALAZINE, ipratropium-albuterol, ondansetron **OR** ondansetron (ZOFRAN) IV,  polyethylene glycol, sodium chloride flush

## 2020-05-07 NOTE — Progress Notes (Signed)
Discharge instructions discussed with patient with help of IPAD interpreter,. As well as daughter via phone, verbalized agreement and understanding

## 2020-05-07 NOTE — Discharge Summary (Signed)
Physician Discharge Summary  Mahad Newstrom ZOX:[id number] DOB: [birth year] DOA: 05/01/2020  PCP: Noralee Stain, DO  Admit date: 05/01/2020 Discharge date: 05/07/2020  Time spent: 50* minutes  Recommendations for Outpatient Follow-up:  1. Follow-up with nephrology in 2 weeks 2. Follow-up PCP in 2 weeks 3.  Discharge Diagnoses:  Active Problems:   Viral pneumonia   Acute hypoxemic respiratory failure [facility])   Discharge Condition: Stable  Diet recommendation: Carb modified diet  Filed Weights   05/01/20 0948 05/05/20 1701 05/07/20 0600  Weight: 132.5 kg (!) 138.9 kg (!) 138.7 kg    History of present illness:  59 y.o.malewith medical history significant ofDM, HTN, HLD who presents to the hospital with chief complaint of SOB, chest tightness, wheezing, cough, malaise, hypoxia. She was recently treated for pneumonia several weeks ago. On arrival to the ED, her oxygen saturation was found to be 76% on room air. He had also had COVID+ over 3 months ago.On examination, he denies any fevers or chest pain or abdominal pain or nausea, vomiting or diarrhea. He does admit to dry cough. His 48-month-old granddaughter had tested positive for RSV recently. He also admits to 2 x 40 ounce beer consumption on a daily basis, last use 12/22.   Hospital Course:  Acute hypoxemic respiratory failure-secondary to RSV, RLL pneumonia.  Also an element of pulmonary edema.  On admission patient's SPO2 was 76% on room air, required 2 L/min of oxygen.  Influenza and COVID-19 were negative.  Chest x-ray on 05/03/2020 obtained showed diffuse interstitial prominence increasing patchy airspace opacity in the right lower lobe.  He was started on Rocephin and azithromycin.  Treated with IV steroids, DuoNeb's.  Patient has been weaned to room air.  Repeat chest x-ray only shows pulmonary edema.  Patient has been receiving Lasix per nephrology and has significantly improved.  Will discharge on prednisone taper for 5  more days.  Acute diastolic CHF-chest x-ray showed pulmonary edema, significantly improved with IV Lasix.  Echocardiogram showed EF 60 to 65%, grade 2 diastolic dysfunction.  IV Lasix has been changed to p.o. Lasix 80 mg daily for 5 more days.  Hypertension-chlorthalidone and lisinopril on hold due to renal failure.  Started on Norvasc 10 mg daily.  Acute kidney injury-patient baseline creatinine 1.23, however creatinine peaked up to 3.8 on 05/05/2020 and has improved to 2.0 today after being diuresed with IV Lasix.  Nephrology recommends to change Lasix to 80 mg daily for 5 more days and follow-up with nephrology on 05/21/2020.  Avoid ARB/ACE inhibitor's  Diabetes mellitus type 2-continue Lantus 60 units subcu daily.  Will hold Metformin ,  Invokana and Ozempic at this time due to renal failure.  He needs to follow-up with PCP after renal function has stabilized.    Procedures:   Consultations:  Nephrology  Discharge Exam: Vitals:   05/07/20 0744 05/07/20 1444  BP:  (!) 189/91  Pulse:  91  Resp:    Temp:    SpO2: 96% 90%    General: Appears in no acute distress Cardiovascular: S1-S2, regular Respiratory: Clear to auscultation bilaterally  Discharge Instructions   Discharge Instructions    Diet - low sodium heart healthy   Complete by: As directed    Discharge instructions   Complete by: As directed    Continue taking Lantus, hold other diabetic medications till your kidney function is back to normal.   Increase activity slowly   Complete by: As directed      Allergies as of 05/07/2020  No Known Allergies     Medication List    STOP taking these medications   chlorthalidone 25 MG tablet Commonly known as: HYGROTON   Invokamet XR 150-500 MG Tb24 Generic drug: Canagliflozin-metFORMIN HCl ER   lisinopril 40 MG tablet Commonly known as: ZESTRIL   metFORMIN 1000 MG tablet Commonly known as: GLUCOPHAGE   Ozempic (0.25 or 0.5 MG/DOSE) 2 MG/1.5ML Sopn Generic  drug: Semaglutide(0.25 or 0.5MG /DOS)     TAKE these medications   amLODipine 10 MG tablet Commonly known as: NORVASC Take 1 tablet (10 mg total) by mouth daily. Start taking on: May 08, 2020   atorvastatin 80 MG tablet Commonly known as: LIPITOR Take 80 mg by mouth daily.   furosemide 80 MG tablet Commonly known as: LASIX Take 1 tablet (80 mg total) by mouth in the morning for 5 days. Start taking on: May 08, 2020   insulin glargine 100 UNIT/ML injection Commonly known as: LANTUS Inject 60 Units into the skin daily.   predniSONE 10 MG tablet Commonly known as: DELTASONE Prednisone 40 mg po daily x 1 day then Prednisone 30 mg po daily x 1 day then Prednisone 20 mg po daily x 1 day then Prednisone 10 mg daily x 1 day then stop...   Symbicort 160-4.5 MCG/ACT inhaler Generic drug: budesonide-formoterol Inhale 1 puff into the lungs daily as needed (sob/wheezing).      No Known Allergies  Follow-up Information    Lenny PastelZeyfang, David, PA-C Follow up on 05/21/2020.   Specialty: Nephrology Why: this is follow up appt with kidney PA / MD for recovering kidney failure, appt is for May 21, 2020, please arrive at 1:00pm for 1:30 appt.  Contact information: [address] [place] [id number] [phone]                The results of significant diagnostics from this hospitalization (including imaging, microbiology, ancillary and laboratory) are listed below for reference.    Significant Diagnostic Studies: DG Chest 2 View  Result Date: 05/06/2020 CLINICAL DATA:  Shortness of breath. EXAM: CHEST - 2 VIEW COMPARISON:  05/03/2020. FINDINGS: Cardiomegaly with mild pulmonary venous congestion. Mild bilateral interstitial prominence. Findings suggest mild interstitial edema. Pneumonitis cannot be excluded. Prominent bandlike area of atelectasis noted the left mid lung. Bilateral pleural thickening consistent scarring again noted. No pneumothorax. Degenerative change thoracic  spine. IMPRESSION: 1. Cardiomegaly with mild pulmonary venous congestion. Mild bilateral interstitial prominence. Findings suggest mild interstitial edema. Pneumonitis cannot be excluded. 2. Prominent bandlike area of atelectasis left mid lung. Electronically Signed   By: Maisie Fushomas  Register   On: 05/06/2020 13:40   CT Angio Chest PE W and/or Wo Contrast  Result Date: 05/01/2020 CLINICAL DATA:  Cough and shortness of breath. Question pulmonary emboli. Low oxygen saturation. EXAM: CT ANGIOGRAPHY CHEST WITH CONTRAST TECHNIQUE: Multidetector CT imaging of the chest was performed using the standard protocol during bolus administration of intravenous contrast. Multiplanar CT image reconstructions and MIPs were obtained to evaluate the vascular anatomy. CONTRAST:  100mL OMNIPAQUE IOHEXOL 350 MG/ML SOLN COMPARISON:  Chest radiography same day FINDINGS: Cardiovascular: Heart size upper limits of normal. Coronary artery calcification is noted. Aortic atherosclerotic calcification is noted. No aneurysm or dissection. Pulmonary arterial opacification is good. There are no pulmonary emboli. Mediastinum/Nodes: No mediastinal or hilar mass or lymphadenopathy. Lungs/Pleura: Bilateral hazy and patchy pulmonary infiltrates, most pronounced in the right upper lobe. Findings are nonspecific and could be due to viral or bacterial pneumonia. No dense consolidation or lobar collapse. No  effusion. Upper Abdomen: Negative Musculoskeletal: Ordinary thoracic degenerative changes. Review of the MIP images confirms the above findings. IMPRESSION: 1. No pulmonary emboli. 2. Bilateral hazy and patchy pulmonary infiltrates, most pronounced in the right upper lobe. Findings are nonspecific and could be due to viral or bacterial pneumonia. No dense consolidation or lobar collapse. 3. Aortic atherosclerosis. Coronary artery calcification. Aortic Atherosclerosis (ICD10-I70.0). Electronically Signed   By: Paulina Fusi M.D.   On: 05/01/2020 11:40    US RENAL  Result Date: 05/02/2020 CLINICAL DATA:  Acute renal injury EXAM: RENAL / URINARY TRACT ULTRASOUND COMPLETE COMPARISON:  11/27/2015 FINDINGS: Right Kidney: Renal measurements: 12.6 x 6.2 x 5.2 cm. = volume: 213 mL. Echogenicity within normal limits. No mass or hydronephrosis visualized. Left Kidney: Renal measurements: 14.3 x 6.8 x 6.6 cm. = volume: 332 mL. Echogenicity within normal limits. No mass or hydronephrosis visualized. Bladder: Appears normal for degree of bladder distention. Other: None. IMPRESSION: No acute abnormality noted. Electronically Signed   By: Alcide Clever M.D.   On: 05/02/2020 11:50   DG CHEST PORT 1 VIEW  Result Date: 05/03/2020 CLINICAL DATA:  Shortness of breath EXAM: PORTABLE CHEST 1 VIEW COMPARISON:  05/01/2020 FINDINGS: Stable cardiomegaly. Diffuse interstitial prominence with increasing patchy airspace opacity in the right lower lobe. No large pleural fluid collection. No pneumothorax. IMPRESSION: Diffuse interstitial prominence with increasing patchy airspace opacity in the right lower lobe, concerning for worsening infection or edema. Electronically Signed   By: Duanne Guess D.O.   On: 05/03/2020 14:26   DG Chest Portable 1 View  Result Date: 05/01/2020 CLINICAL DATA:  Hypoxia. EXAM: PORTABLE CHEST 1 VIEW COMPARISON:  None. FINDINGS: Enlarged cardiac silhouette. Pulmonary vascular congestion. No consolidation. No visible pleural effusions or pneumothorax. No acute osseous abnormality. IMPRESSION: Cardiomegaly and pulmonary vascular congestion without overt pulmonary edema. No focal consolidation. Electronically Signed   By: Feliberto Harts MD   On: 05/01/2020 10:30   ECHOCARDIOGRAM COMPLETE  Result Date: 05/04/2020    ECHOCARDIOGRAM REPORT   Patient Name:   FLOYED MASOUD Date of Exam: 05/04/2020 Medical Rec #:  [id number]     Height:       70.0 in Accession #:    [id number]    Weight:       292.0 lb Date of Birth:  05-10-1961     BSA:          2.452  m Patient Age:    59 years      BP:           146/68 mmHg Patient Gender: M             HR:           86 bpm. Exam Location:  Inpatient Procedure: 2D Echo and Intracardiac Opacification Agent Indications:    Acute Respiratory Distress R06.03  History:        Patient has no prior history of Echocardiogram examinations.                 Risk Factors:Hypertension and Diabetes.  Sonographer:    Thurman Coyer RDCS (AE) Referring Phys: [id number] [facility]  Sonographer Comments: Technically difficult study due to poor echo windows. Image acquisition challenging due to patient body habitus. IMPRESSIONS  1. Left ventricular ejection fraction, by estimation, is 60 to 65%. The left ventricle has normal function. The left ventricle has no regional wall motion abnormalities. There is moderate concentric left ventricular hypertrophy. Left ventricular diastolic parameters are consistent with Grade II diastolic  dysfunction (pseudonormalization). Elevated left atrial pressure.  2. Right ventricular systolic function is normal. The right ventricular size is normal. There is normal pulmonary artery systolic pressure.  3. Left atrial size was moderately dilated.  4. The mitral valve is normal in structure. Mild mitral valve regurgitation. No evidence of mitral stenosis.  5. The aortic valve has an indeterminant number of cusps. There is moderate calcification of the aortic valve. There is moderate thickening of the aortic valve. Aortic valve regurgitation is not visualized. Mild aortic valve stenosis. Aortic valve mean gradient measures 10.0 mmHg.  6. The inferior vena cava is dilated in size with >50% respiratory variability, suggesting right atrial pressure of 8 mmHg. FINDINGS  Left Ventricle: Left ventricular ejection fraction, by estimation, is 60 to 65%. The left ventricle has normal function. The left ventricle has no regional wall motion abnormalities. Definity contrast agent was given IV to delineate the left ventricular   endocardial borders. The left ventricular internal cavity size was normal in size. There is moderate concentric left ventricular hypertrophy. Left ventricular diastolic parameters are consistent with Grade II diastolic dysfunction (pseudonormalization).  Elevated left atrial pressure. Right Ventricle: The right ventricular size is normal. No increase in right ventricular wall thickness. Right ventricular systolic function is normal. There is normal pulmonary artery systolic pressure. Left Atrium: Left atrial size was moderately dilated. Right Atrium: Right atrial size was normal in size. Pericardium: There is no evidence of pericardial effusion. Mitral Valve: The mitral valve is normal in structure. Mild mitral valve regurgitation. No evidence of mitral valve stenosis. Tricuspid Valve: The tricuspid valve is normal in structure. Tricuspid valve regurgitation is mild . No evidence of tricuspid stenosis. Aortic Valve: The aortic valve has an indeterminant number of cusps. There is moderate calcification of the aortic valve. There is moderate thickening of the aortic valve. Aortic valve regurgitation is not visualized. Mild aortic stenosis is present. Aortic valve mean gradient measures 10.0 mmHg. Aortic valve peak gradient measures 15.5 mmHg. Aortic valve area, by VTI measures 3.58 cm. Pulmonic Valve: The pulmonic valve was normal in structure. Pulmonic valve regurgitation is not visualized. No evidence of pulmonic stenosis. Aorta: The aortic root is normal in size and structure. Venous: The inferior vena cava is dilated in size with greater than 50% respiratory variability, suggesting right atrial pressure of 8 mmHg. IAS/Shunts: No atrial level shunt detected by color flow Doppler.  LEFT VENTRICLE PLAX 2D LVIDd:         5.90 cm  Diastology LVIDs:         3.70 cm  LV e' medial:    5.98 cm/s LV PW:         1.30 cm  LV E/e' medial:  19.1 LV IVS:        1.30 cm  LV e' lateral:   10.10 cm/s LVOT diam:     2.50 cm  LV  E/e' lateral: 11.3 LV SV:         136 LV SV Index:   55 LVOT Area:     4.91 cm  RIGHT VENTRICLE RV S prime:     18.40 cm/s TAPSE (M-mode): 2.6 cm LEFT ATRIUM             Index       RIGHT ATRIUM           Index LA diam:        5.20 cm 2.12 cm/m  RA Area:     13.70 cm LA Vol (A2C):  92.7 ml 37.81 ml/m RA Volume:   29.60 ml  12.07 ml/m LA Vol (A4C):   68.8 ml 28.06 ml/m LA Biplane Vol: 81.8 ml 33.37 ml/m  AORTIC VALVE AV Area (Vmax):    3.29 cm AV Area (Vmean):   3.46 cm AV Area (VTI):     3.58 cm AV Vmax:           197.00 cm/s AV Vmean:          152.000 cm/s AV VTI:            0.380 m AV Peak Grad:      15.5 mmHg AV Mean Grad:      10.0 mmHg LVOT Vmax:         132.00 cm/s LVOT Vmean:        107.000 cm/s LVOT VTI:          0.277 m LVOT/AV VTI ratio: 0.73  AORTA Ao Root diam: 3.80 cm MITRAL VALVE MV Area (PHT): 3.99 cm     SHUNTS MV Decel Time: 190 msec     Systemic VTI:  0.28 m MV E velocity: 114.00 cm/s  Systemic Diam: 2.50 cm MV A velocity: 107.00 cm/s MV E/A ratio:  1.07 Tobias Alexander MD Electronically signed by Tobias Alexander MD Signature Date/Time: 05/04/2020/3:38:06 PM    Final     Microbiology: Recent Results (from the past 240 hour(s))  Blood culture (routine x 2)     Status: None (Preliminary result)   Collection Time: 05/01/20 10:00 AM   Specimen: BLOOD  Result Value Ref Range Status   Specimen Description   Final    BLOOD LEFT ANTECUBITAL Performed at [facility], [address] [place], [place] [address]    Special Requests   Final    BOTTLES DRAWN AEROBIC AND ANAEROBIC Blood Culture adequate volume Performed at [facility], [address] [place], [place] [address]    Culture  Setup Time   Final    GRAM POSITIVE COCCI IN CLUSTERS GRAM POSITIVE RODS ANAEROBIC BOTTLE ONLY Organism ID to follow CRITICAL RESULT CALLED TO, READ BACK BY AND VERIFIED WITHTalmadge Chad RN [id number] 05/05/20 A BROWNING    Culture   Final    CULTURE REINCUBATED FOR  BETTER GROWTH Performed at [facility], [address] [place], [place] [address]    Report Status PENDING  Incomplete  Blood Culture ID Panel (Reflexed)     Status: None   Collection Time: 05/01/20 10:00 AM  Result Value Ref Range Status   Enterococcus faecalis NOT DETECTED NOT DETECTED Final   Enterococcus Faecium NOT DETECTED NOT DETECTED Final   Listeria monocytogenes NOT DETECTED NOT DETECTED Final   Staphylococcus species NOT DETECTED NOT DETECTED Final   Staphylococcus aureus (BCID) NOT DETECTED NOT DETECTED Final   Staphylococcus epidermidis NOT DETECTED NOT DETECTED Final   Staphylococcus lugdunensis NOT DETECTED NOT DETECTED Final   Streptococcus species NOT DETECTED NOT DETECTED Final   Streptococcus agalactiae NOT DETECTED NOT DETECTED Final   Streptococcus pneumoniae NOT DETECTED NOT DETECTED Final   Streptococcus pyogenes NOT DETECTED NOT DETECTED Final   A.calcoaceticus-baumannii NOT DETECTED NOT DETECTED Final   Bacteroides fragilis NOT DETECTED NOT DETECTED Final   Enterobacterales NOT DETECTED NOT DETECTED Final   Enterobacter cloacae complex NOT DETECTED NOT DETECTED Final   Escherichia coli NOT DETECTED NOT DETECTED Final   Klebsiella aerogenes NOT DETECTED NOT DETECTED Final   Klebsiella oxytoca NOT DETECTED NOT DETECTED  Final   Klebsiella pneumoniae NOT DETECTED NOT DETECTED Final   Proteus species NOT DETECTED NOT DETECTED Final   Salmonella species NOT DETECTED NOT DETECTED Final   Serratia marcescens NOT DETECTED NOT DETECTED Final   Haemophilus influenzae NOT DETECTED NOT DETECTED Final   Neisseria meningitidis NOT DETECTED NOT DETECTED Final   Pseudomonas aeruginosa NOT DETECTED NOT DETECTED Final   Stenotrophomonas maltophilia NOT DETECTED NOT DETECTED Final   Candida albicans NOT DETECTED NOT DETECTED Final   Candida auris NOT DETECTED NOT DETECTED Final   Candida glabrata NOT DETECTED NOT DETECTED Final   Candida krusei NOT DETECTED NOT  DETECTED Final   Candida parapsilosis NOT DETECTED NOT DETECTED Final   Candida tropicalis NOT DETECTED NOT DETECTED Final   Cryptococcus neoformans/gattii NOT DETECTED NOT DETECTED Final    Comment: Performed at [facility], [address], [place], [place] [address]  Resp Panel by RT-PCR (Flu A&B, Covid) Nasopharyngeal Swab     Status: None   Collection Time: 05/01/20 10:20 AM   Specimen: Nasopharyngeal Swab; Nasopharyngeal(NP) swabs in vial transport medium  Result Value Ref Range Status   SARS Coronavirus 2 by RT PCR NEGATIVE NEGATIVE Final    Comment: (NOTE) SARS-CoV-2 target nucleic acids are NOT DETECTED.  The SARS-CoV-2 RNA is generally detectable in upper respiratory specimens during the acute phase of infection. The lowest concentration of SARS-CoV-2 viral copies this assay can detect is 138 copies/mL. A negative result does not preclude SARS-Cov-2 infection and should not be used as the sole basis for treatment or other patient management decisions. A negative result may occur with  improper specimen collection/handling, submission of specimen other than nasopharyngeal swab, presence of viral mutation(s) within the areas targeted by this assay, and inadequate number of viral copies(<138 copies/mL). A negative result must be combined with clinical observations, patient history, and epidemiological information. The expected result is Negative.  Fact Sheet for Patients:  BloggerCourse.com  Fact Sheet for Healthcare Providers:  SeriousBroker.it  This test is no t yet approved or cleared by the [place] FDA and  has been authorized for detection and/or diagnosis of SARS-CoV-2 by FDA under an Emergency Use Authorization (EUA). This EUA will remain  in effect (meaning this test can be used) for the duration of the COVID-19 declaration under Section 564(b)(1) of the Act, 21 U.S.C.section 360bbb-3(b)(1), unless the  authorization is terminated  or revoked sooner.       Influenza A by PCR NEGATIVE NEGATIVE Final   Influenza B by PCR NEGATIVE NEGATIVE Final    Comment: (NOTE) The Xpert Xpress SARS-CoV-2/FLU/RSV plus assay is intended as an aid in the diagnosis of influenza from Nasopharyngeal swab specimens and should not be used as a sole basis for treatment. Nasal washings and aspirates are unacceptable for Xpert Xpress SARS-CoV-2/FLU/RSV testing.  Fact Sheet for Patients: BloggerCourse.com  Fact Sheet for Healthcare Providers: SeriousBroker.it  This test is not yet approved or cleared by the [place] FDA and has been authorized for detection and/or diagnosis of SARS-CoV-2 by FDA under an Emergency Use Authorization (EUA). This EUA will remain in effect (meaning this test can be used) for the duration of the COVID-19 declaration under Section 564(b)(1) of the Act, 21 U.S.C. section 360bbb-3(b)(1), unless the authorization is terminated or revoked.  Performed at [facility], [address] [place], [place] [address]   Blood culture (routine x 2)     Status: None   Collection Time: 05/01/20 11:30 AM  Specimen: BLOOD  Result Value Ref Range Status   Specimen Description   Final    BLOOD BLOOD LEFT FOREARM Performed at [facility], [address] [place], [place] [address]    Special Requests   Final    BOTTLES DRAWN AEROBIC AND ANAEROBIC Blood Culture adequate volume Performed at [facility], [address] [place], [place] [address]    Culture   Final    NO GROWTH 5 DAYS Performed at [facility], [address], [place], [place] [address]    Report Status 05/06/2020 FINAL  Final  Culture, blood (routine x 2)     Status: None (Preliminary result)   Collection Time: 05/05/20  4:01 PM   Specimen: BLOOD RIGHT HAND  Result Value Ref Range Status   Specimen Description   Final     BLOOD RIGHT HAND Performed at [facility], [address], [place], [place] [address]    Special Requests   Final    BOTTLES DRAWN AEROBIC ONLY Blood Culture adequate volume Performed at [facility], [address], [place], [place] [address]    Culture   Final    NO GROWTH 2 DAYS Performed at [facility], [address], [place], [place] [address]    Report Status PENDING  Incomplete  Culture, blood (routine x 2)     Status: None (Preliminary result)   Collection Time: 05/05/20  4:01 PM   Specimen: BLOOD LEFT HAND  Result Value Ref Range Status   Specimen Description   Final    BLOOD LEFT HAND Performed at [facility], [address], [place], [place] [address]    Special Requests   Final    BOTTLES DRAWN AEROBIC ONLY Blood Culture adequate volume Performed at [facility], [address], [place], [place] [address]    Culture   Final    NO GROWTH 2 DAYS Performed at [facility], [address] [place], [place] [address]    Report Status PENDING  Incomplete     Labs: Basic Metabolic Panel: Recent Labs  Lab 05/03/20 0410 05/03/20 1157 05/03/20 2316 05/04/20 0426 05/05/20 0418 05/06/20 0553 05/07/20 0449  NA 131*  --   --  132* 133* 137 139  K 5.4*   < > 5.2* 5.1 5.2* 5.2* 5.0  CL 99  --   --  102 102 104 103  CO2 22  --   --  22 23 23 27   GLUCOSE 449*  --   --  224* 155* 113* 120*  BUN 64*  --   --  76* 91* 95* 87*  CREATININE 2.35*  --   --  3.37* 3.80* 2.89* 2.09*  CALCIUM 8.1*  --   --  7.9* 8.2* 8.4* 8.6*  MG 2.7*  --   --   --   --   --   --    < > = values in this interval not displayed.   Liver Function Tests: Recent Labs  Lab 05/01/20 1000  AST 25  ALT 20  ALKPHOS 114  BILITOT 0.4  PROT 6.7  ALBUMIN 2.8*   No results for input(s): LIPASE, AMYLASE in the last 168 hours. No results for input(s): AMMONIA in the last 168 hours. CBC: Recent Labs  Lab  05/01/20 1000 05/02/20 0541 05/03/20 0410 05/05/20 0418  WBC 5.1 5.3 5.0 6.6  NEUTROABS 3.5  --   --   --  HGB 13.8 12.7* 12.9* 11.7*  HCT 41.5 39.4 40.8 36.4*  MCV 91.6 96.8 96.0 95.0  PLT 178 176 196 170   BNP: BNP (last 3 results) Recent Labs    05/01/20 1000  BNP 169.2*    ProBNP (last 3 results) No results for input(s): PROBNP in the last 8760 hours.  CBG: Recent Labs  Lab 05/06/20 2056 05/07/20 0031 05/07/20 0504 05/07/20 0745 05/07/20 1203  GLUCAP 90 84 111* 116* 154*       Signed:  Meredeth Ide MD.  [facility] 05/07/2020, 3:53 PM

## 2020-05-10 LAB — CULTURE, BLOOD (ROUTINE X 2)
Culture: NO GROWTH
Culture: NO GROWTH
Special Requests: ADEQUATE
Special Requests: ADEQUATE
Special Requests: ADEQUATE

## 2022-02-21 IMAGING — US US RENAL
1 series · 14 of 25 positions shown · non-contrast
Comparison: 11/27/2015

CLINICAL DATA: Acute renal injury

EXAM:
RENAL / URINARY TRACT ULTRASOUND COMPLETE

[Series 1: us renal · 14 of 39 slices shown]
[im 1/39]
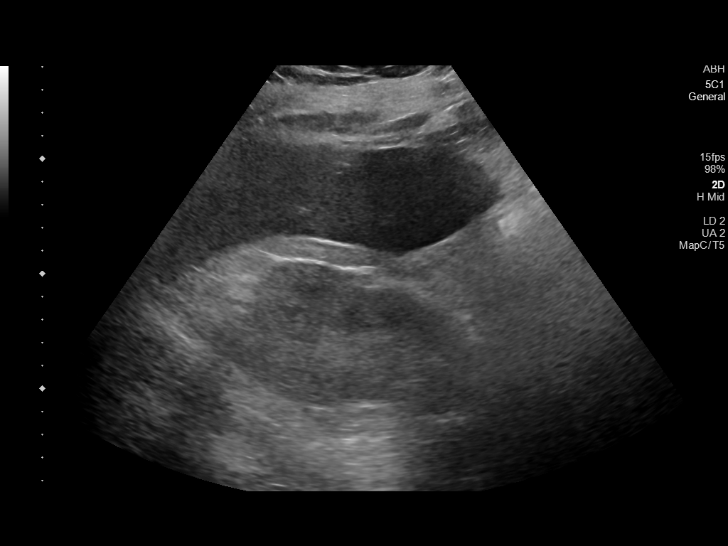
[im 4/39]
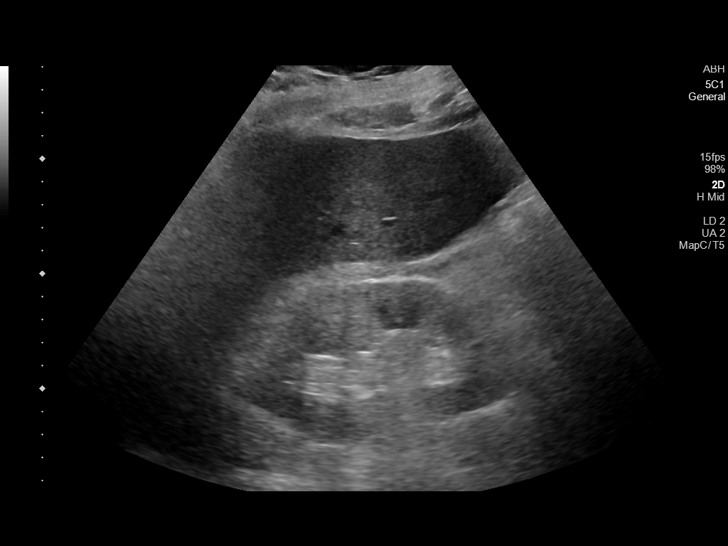
[im 7/39]
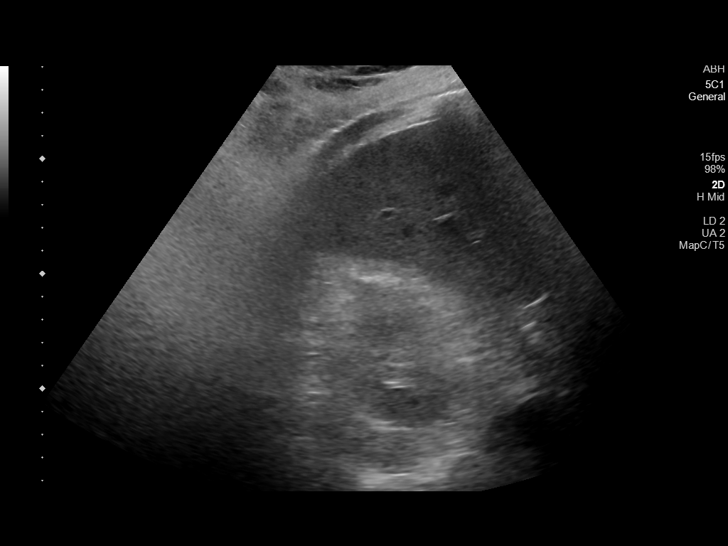
[im 10/39]
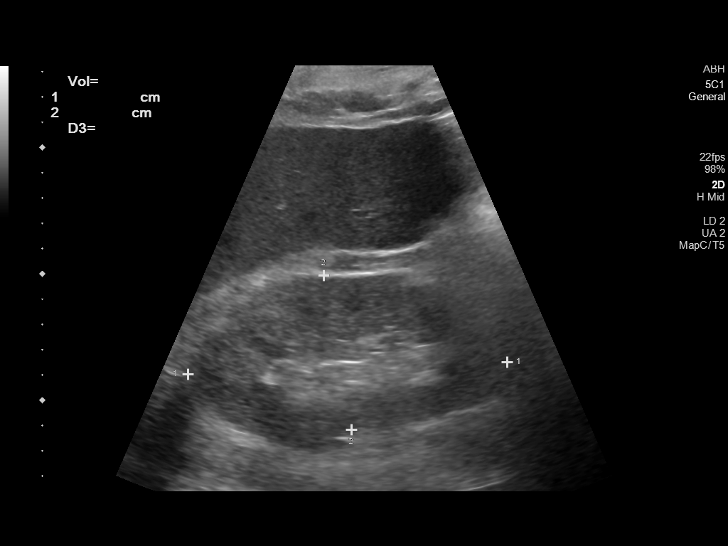
[im 13/39]
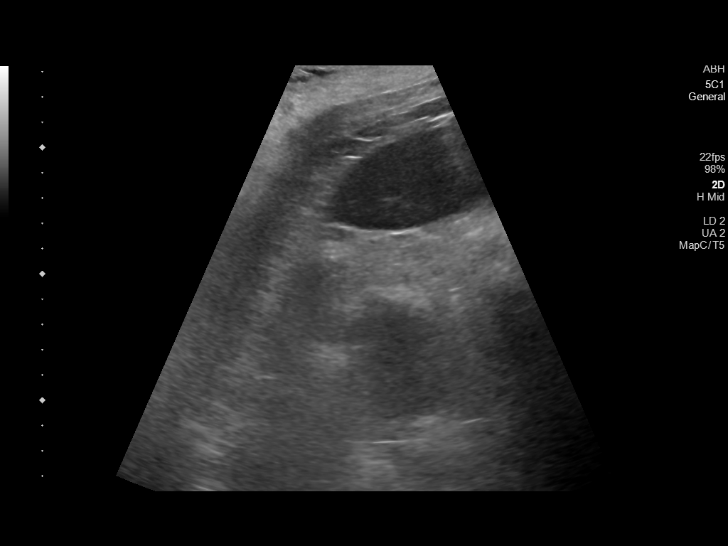
[im 15/39]
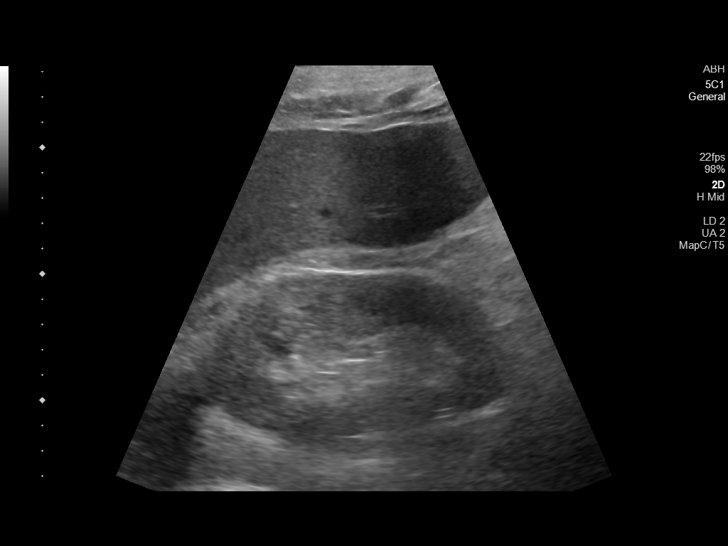
[im 18/39]
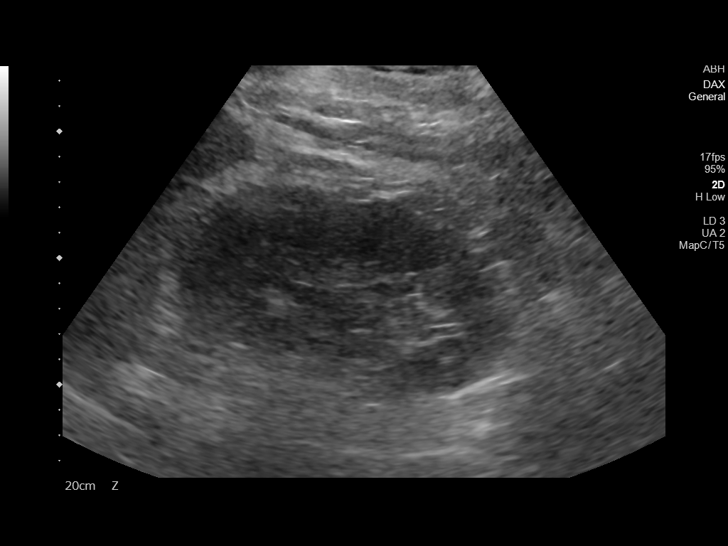
[im 21/39]
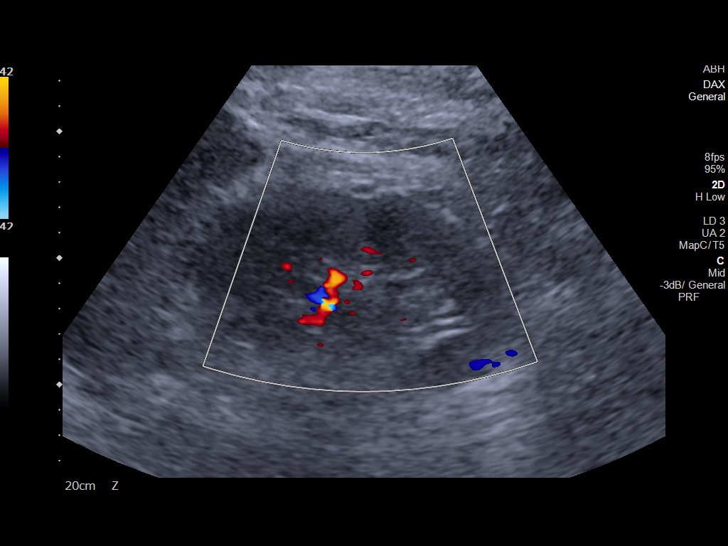
[im 24/39]
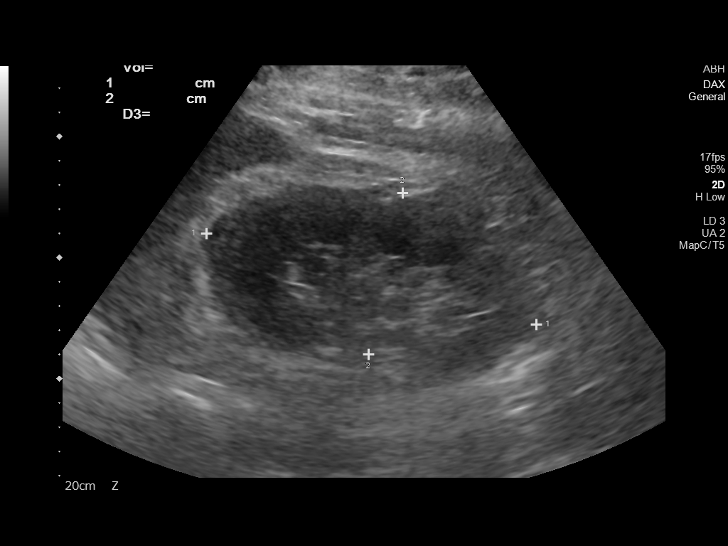
[im 26/39]
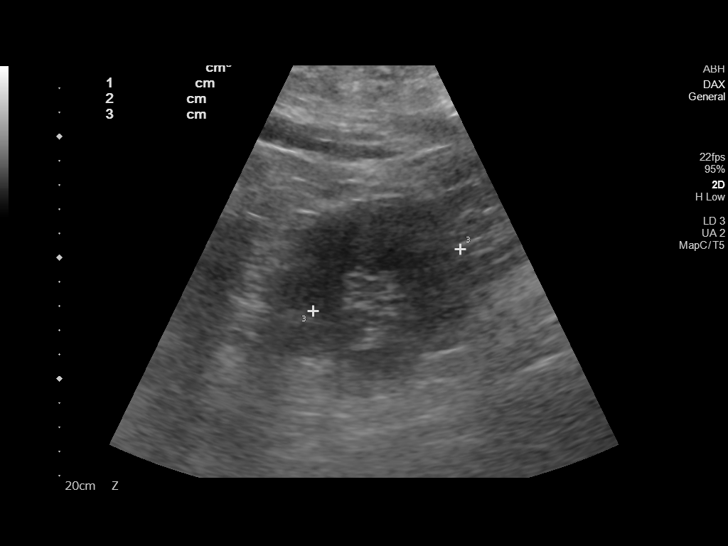
[im 29/39]
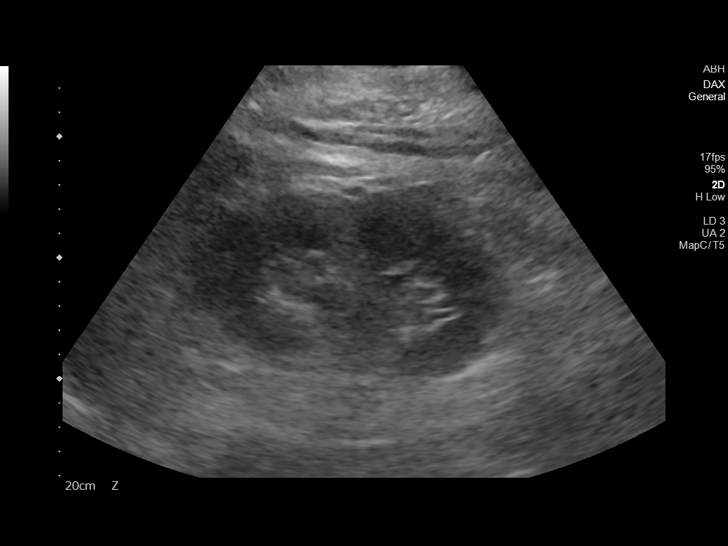
[im 32/39]
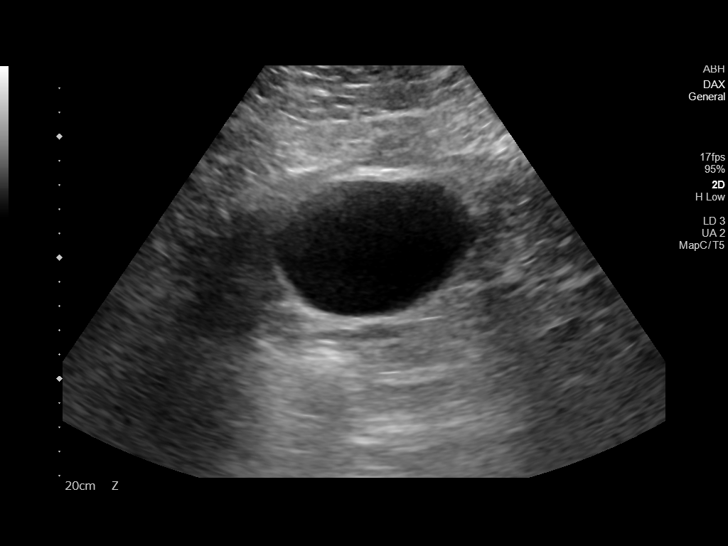
[im 35/39]
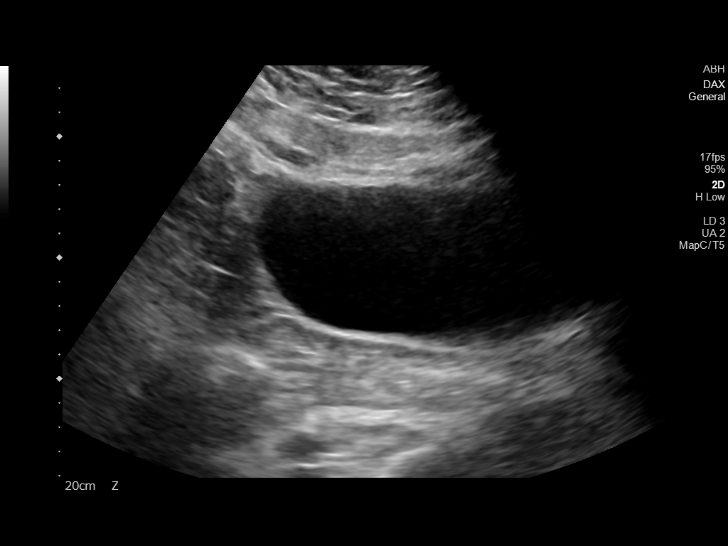
[im 39/39]
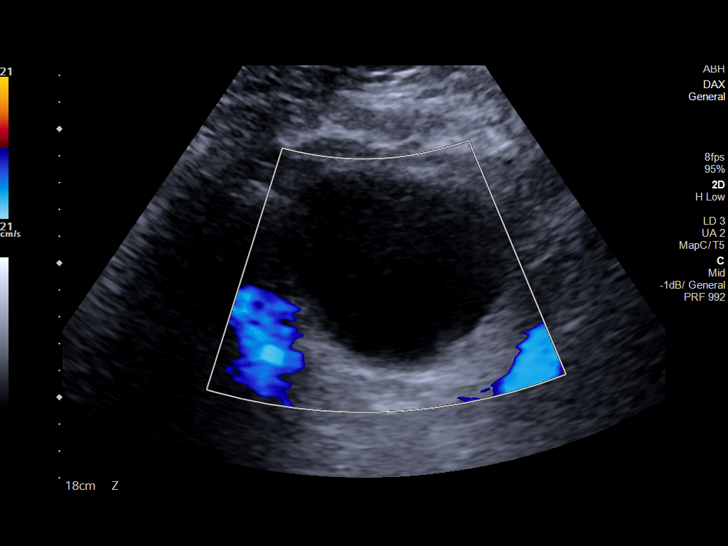

[14 of 25 positions shown; findings below may reference images not displayed]

FINDINGS: Right Kidney:

Renal measurements: 12.6 x 6.2 x 5.2 cm. = volume: 213 mL.
Echogenicity within normal limits. No mass or hydronephrosis
visualized.

Left Kidney:

Renal measurements: 14.3 x 6.8 x 6.6 cm. = volume: 332 mL.
Echogenicity within normal limits. No mass or hydronephrosis
visualized.

Bladder:

Appears normal for degree of bladder distention.

Other:

None.
IMPRESSION: No acute abnormality noted.

## 2022-02-22 IMAGING — DX DG CHEST 1V PORT
1 series · 1 of 1 positions shown · non-contrast
Comparison: 05/01/2020

CLINICAL DATA: Shortness of breath

EXAM:
PORTABLE CHEST 1 VIEW

[chest ap]
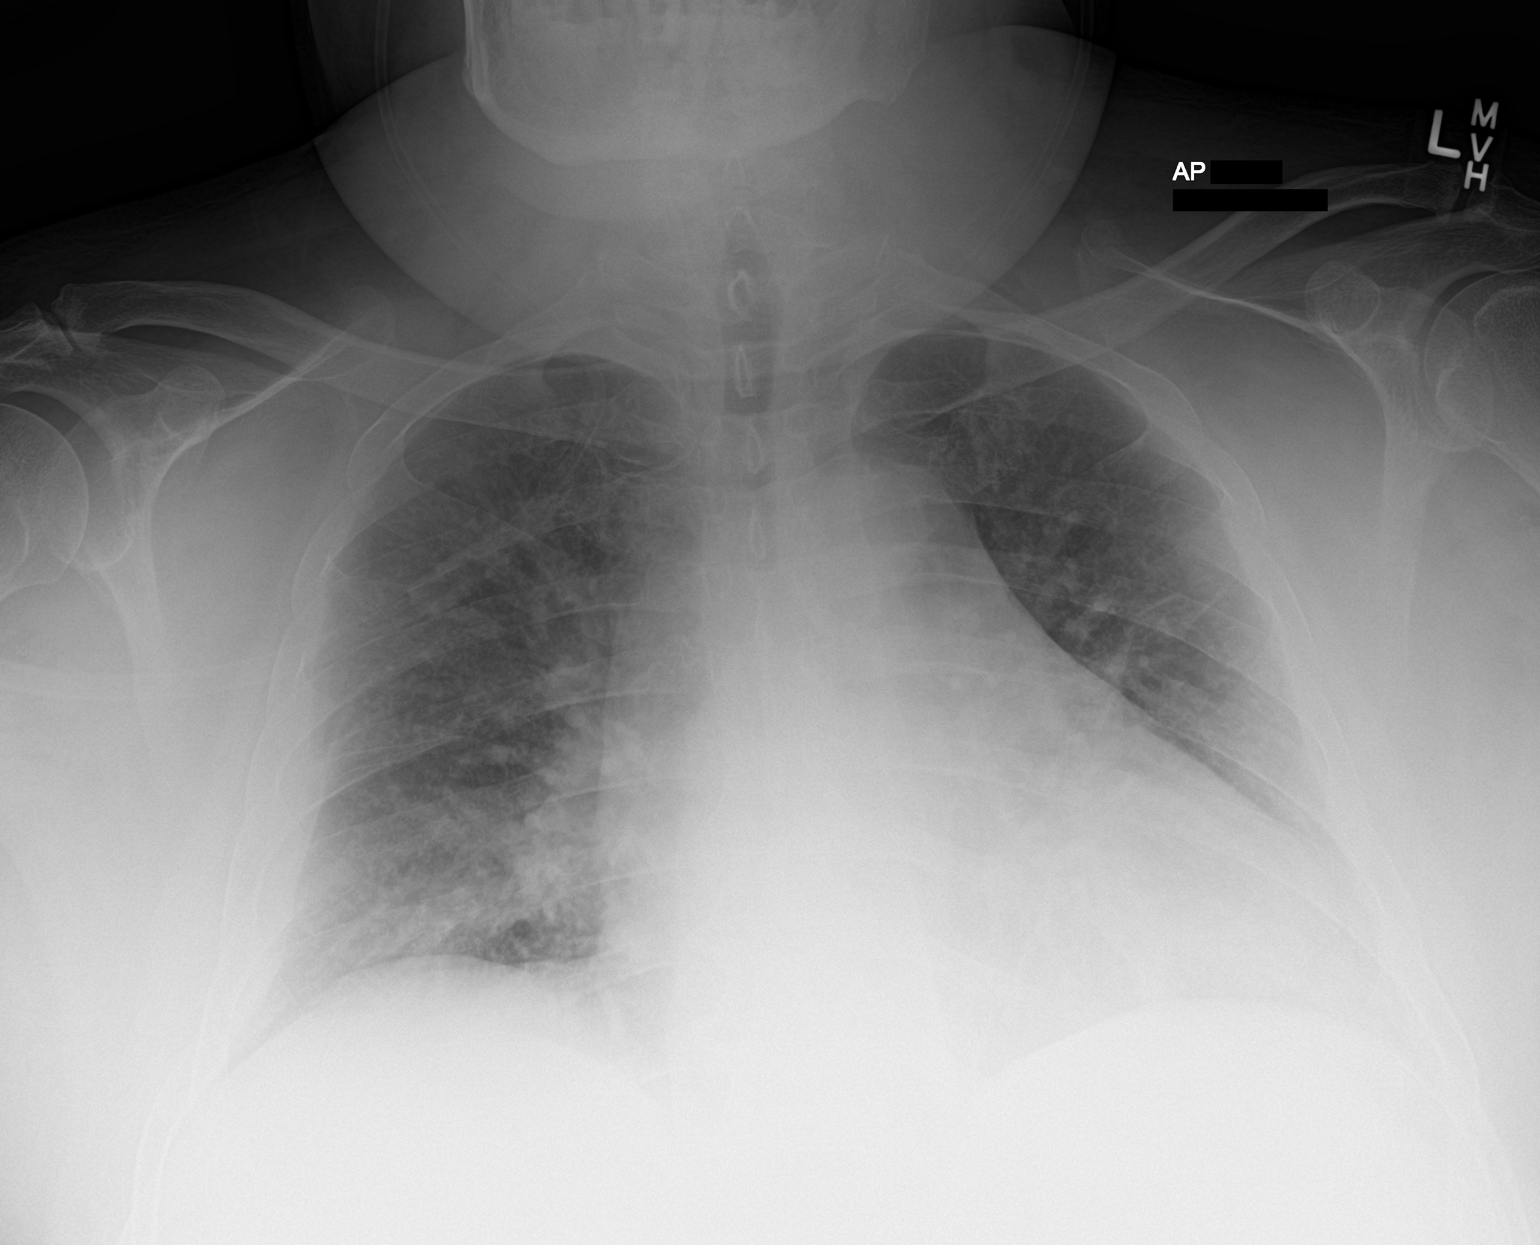

[1 of 1 positions shown; findings below may reference images not displayed]

FINDINGS: Stable cardiomegaly. Diffuse interstitial prominence with increasing
patchy airspace opacity in the right lower lobe. No large pleural
fluid collection. No pneumothorax.
IMPRESSION: Diffuse interstitial prominence with increasing patchy airspace
opacity in the right lower lobe, concerning for worsening infection
or edema.

## 2022-04-22 ENCOUNTER — Other Ambulatory Visit (HOSPITAL_BASED_OUTPATIENT_CLINIC_OR_DEPARTMENT_OTHER): Payer: Self-pay

## 2022-04-22 MED ORDER — OZEMPIC (2 MG/DOSE) 8 MG/3ML ~~LOC~~ SOPN
2.0000 mg | PEN_INJECTOR | SUBCUTANEOUS | 1 refills | Status: AC
  Filled 2022-04-22: qty 3, 28d supply, fill #0
  Filled 2022-10-13: qty 3, 28d supply, fill #1

## 2022-04-23 ENCOUNTER — Other Ambulatory Visit (HOSPITAL_BASED_OUTPATIENT_CLINIC_OR_DEPARTMENT_OTHER): Payer: Self-pay

## 2022-04-24 ENCOUNTER — Other Ambulatory Visit (HOSPITAL_BASED_OUTPATIENT_CLINIC_OR_DEPARTMENT_OTHER): Payer: Self-pay

## 2022-08-05 ENCOUNTER — Other Ambulatory Visit (HOSPITAL_BASED_OUTPATIENT_CLINIC_OR_DEPARTMENT_OTHER): Payer: Self-pay

## 2022-08-05 MED ORDER — OZEMPIC (2 MG/DOSE) 8 MG/3ML ~~LOC~~ SOPN
PEN_INJECTOR | SUBCUTANEOUS | 1 refills | Status: DC
  Filled 2022-08-05: qty 3, 28d supply, fill #0
  Filled 2022-08-14: qty 9, 90d supply, fill #0

## 2022-08-12 ENCOUNTER — Other Ambulatory Visit (HOSPITAL_BASED_OUTPATIENT_CLINIC_OR_DEPARTMENT_OTHER): Payer: Self-pay

## 2022-08-14 ENCOUNTER — Other Ambulatory Visit (HOSPITAL_BASED_OUTPATIENT_CLINIC_OR_DEPARTMENT_OTHER): Payer: Self-pay

## 2022-10-13 ENCOUNTER — Other Ambulatory Visit (HOSPITAL_BASED_OUTPATIENT_CLINIC_OR_DEPARTMENT_OTHER): Payer: Self-pay

## 2022-11-24 ENCOUNTER — Other Ambulatory Visit (HOSPITAL_BASED_OUTPATIENT_CLINIC_OR_DEPARTMENT_OTHER): Payer: Self-pay

## 2022-11-24 MED ORDER — OZEMPIC (2 MG/DOSE) 8 MG/3ML ~~LOC~~ SOPN
2.0000 mg | PEN_INJECTOR | SUBCUTANEOUS | 1 refills | Status: DC
  Filled 2022-11-24: qty 9, 84d supply, fill #0

## 2022-11-29 ENCOUNTER — Other Ambulatory Visit (HOSPITAL_BASED_OUTPATIENT_CLINIC_OR_DEPARTMENT_OTHER): Payer: Self-pay

## 2023-06-07 ENCOUNTER — Other Ambulatory Visit (HOSPITAL_COMMUNITY): Payer: Self-pay
